# Patient Record
Sex: Female | Born: 1960 | Race: Black or African American | Hispanic: No | Smoking: Never smoker
Health system: Southern US, Community
[De-identification: ages and names within clinical notes are randomized; demographics above are authoritative.]

## PROBLEM LIST (undated history)

## (undated) DIAGNOSIS — D649 Anemia, unspecified: Secondary | ICD-10-CM

## (undated) DIAGNOSIS — D219 Benign neoplasm of connective and other soft tissue, unspecified: Secondary | ICD-10-CM

## (undated) HISTORY — PX: TUBAL LIGATION: SHX77

---

## 2001-05-26 ENCOUNTER — Other Ambulatory Visit: Admission: RE | Admit: 2001-05-26 | Discharge: 2001-05-26 | Payer: Self-pay | Admitting: *Deleted

## 2003-01-18 ENCOUNTER — Emergency Department (HOSPITAL_COMMUNITY): Admission: EM | Admit: 2003-01-18 | Discharge: 2003-01-18 | Payer: Self-pay | Admitting: *Deleted

## 2003-01-18 ENCOUNTER — Encounter: Payer: Self-pay | Admitting: *Deleted

## 2007-12-02 ENCOUNTER — Ambulatory Visit (HOSPITAL_COMMUNITY): Admission: RE | Admit: 2007-12-02 | Discharge: 2007-12-02 | Payer: Self-pay | Admitting: Family Medicine

## 2008-09-16 ENCOUNTER — Emergency Department (HOSPITAL_COMMUNITY): Admission: EM | Admit: 2008-09-16 | Discharge: 2008-09-16 | Payer: Self-pay | Admitting: Emergency Medicine

## 2009-12-25 ENCOUNTER — Ambulatory Visit (HOSPITAL_COMMUNITY): Admission: RE | Admit: 2009-12-25 | Discharge: 2009-12-25 | Payer: Self-pay | Admitting: Family Medicine

## 2011-09-10 ENCOUNTER — Other Ambulatory Visit: Payer: Self-pay | Admitting: Family Medicine

## 2011-09-10 LAB — CBC
HCT: 32.3 — ABNORMAL LOW
Hemoglobin: 10.5 — ABNORMAL LOW
MCHC: 32.4
MCV: 79.3
Platelets: 175
RBC: 4.07
RDW: 21.3 — ABNORMAL HIGH
WBC: 7

## 2011-09-10 LAB — DIFFERENTIAL
Basophils Absolute: 0
Basophils Relative: 0
Neutro Abs: 5.3
Neutrophils Relative %: 75

## 2011-09-10 LAB — PREGNANCY, URINE: Preg Test, Ur: NEGATIVE

## 2011-09-12 ENCOUNTER — Ambulatory Visit (HOSPITAL_COMMUNITY)
Admission: RE | Admit: 2011-09-12 | Discharge: 2011-09-12 | Disposition: A | Discharge status: Home / Self Care | Payer: BC Managed Care – PPO | Source: Ambulatory Visit | Attending: Family Medicine | Admitting: Family Medicine

## 2011-09-12 DIAGNOSIS — R109 Unspecified abdominal pain: Secondary | ICD-10-CM | POA: Insufficient documentation

## 2012-01-03 IMAGING — US US ABDOMEN COMPLETE
1 series · 14 of 25 positions shown · non-contrast
Comparison: None

CLINICAL DATA: Abdominal pain

ULTRASOUND ABDOMEN:
TECHNIQUE: Sonography of upper abdominal structures was performed.

[Series 1: us abdomen complete · 0.21mm/px · 14 of 92 slices shown]
[im 1/92]
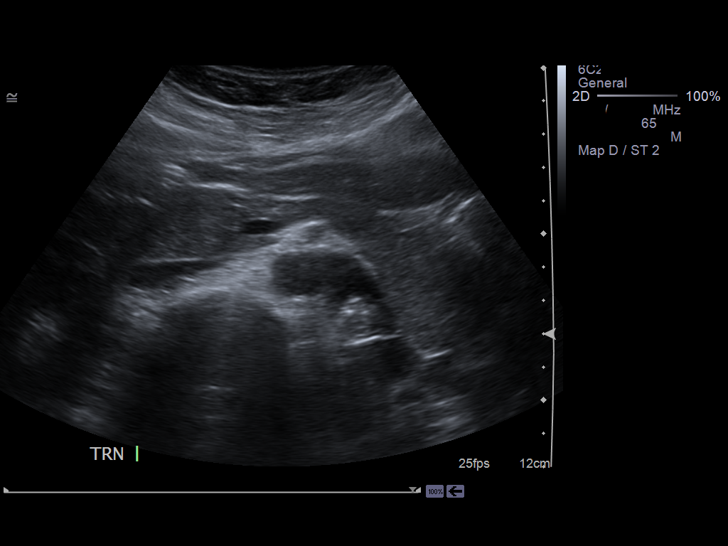
[im 8/92]
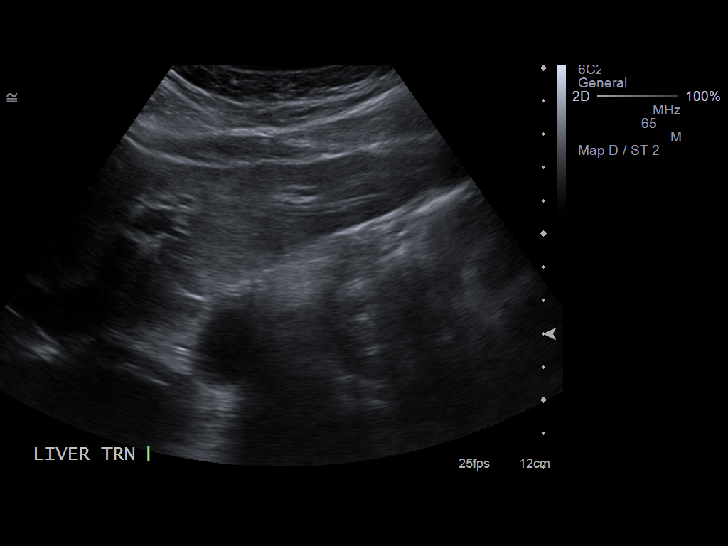
[im 16/92]
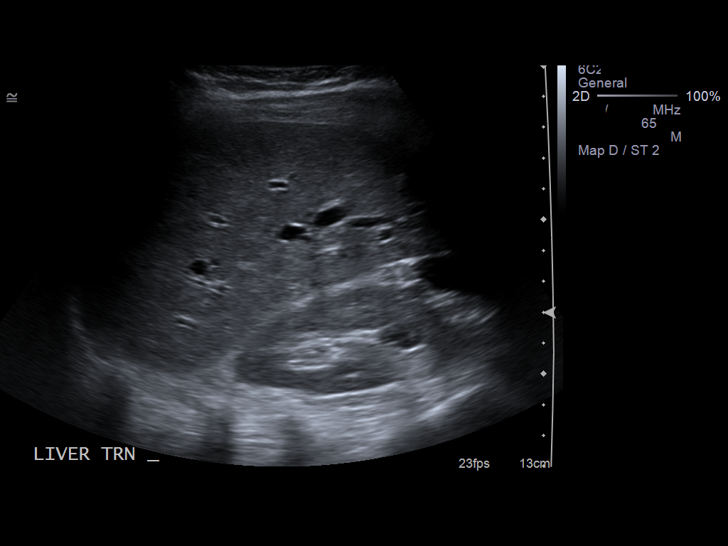
[im 23/92]
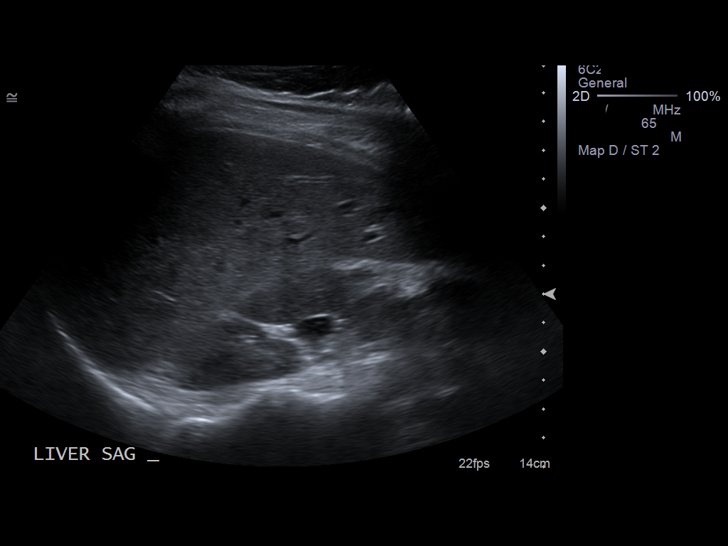
[im 31/92]
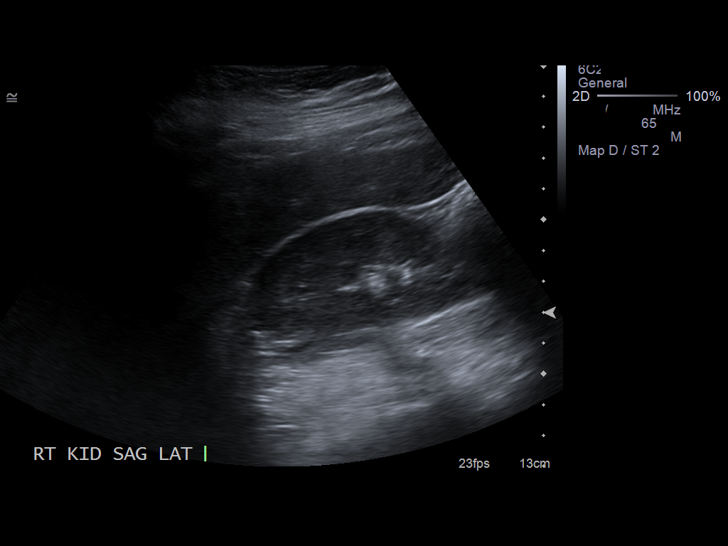
[im 35/92]
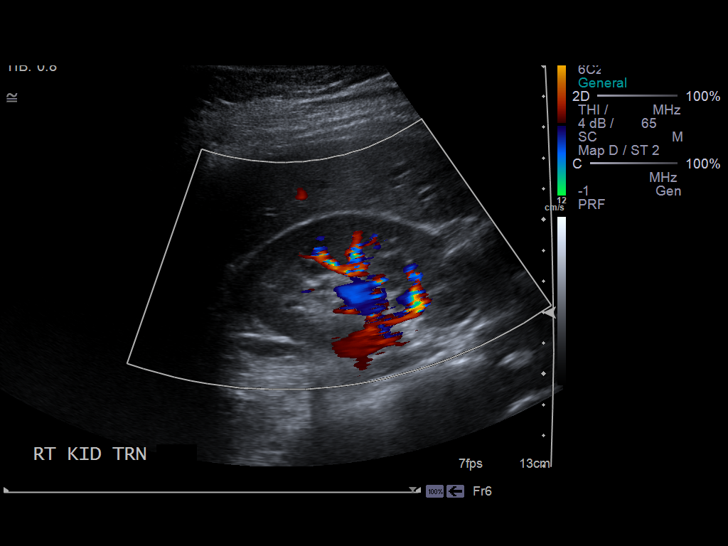
[im 42/92]
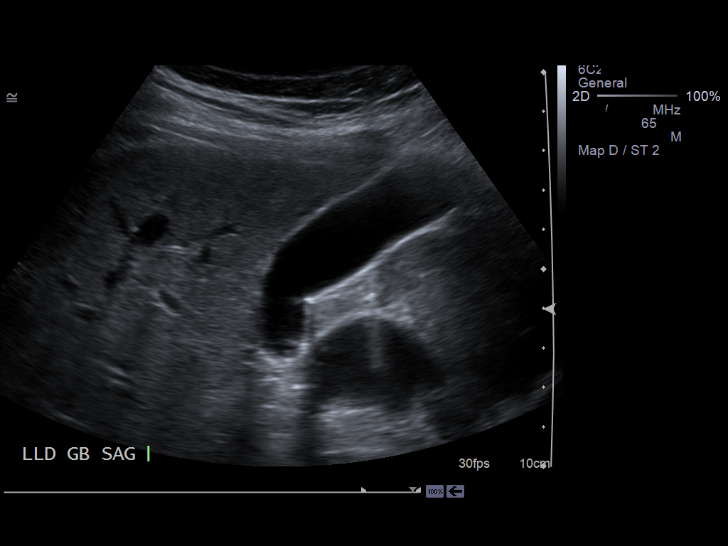
[im 50/92]
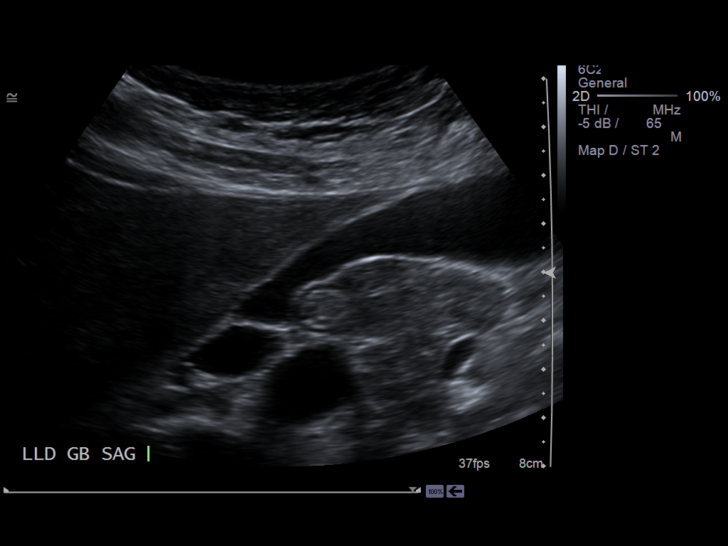
[im 57/92]
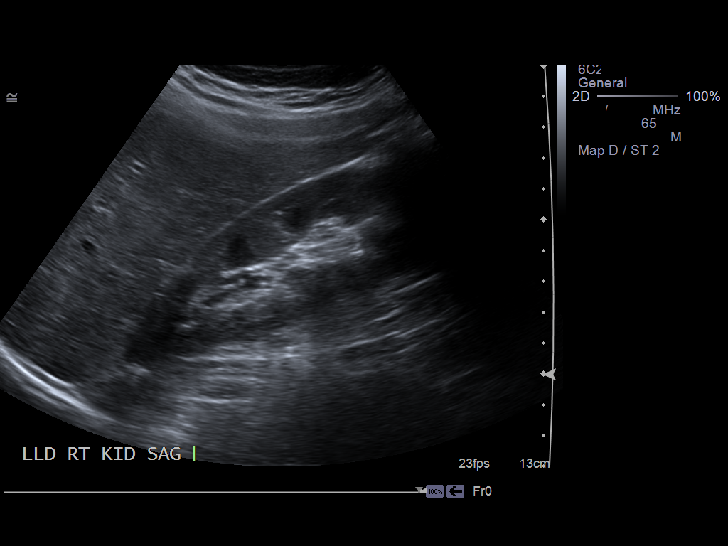
[im 61/92]
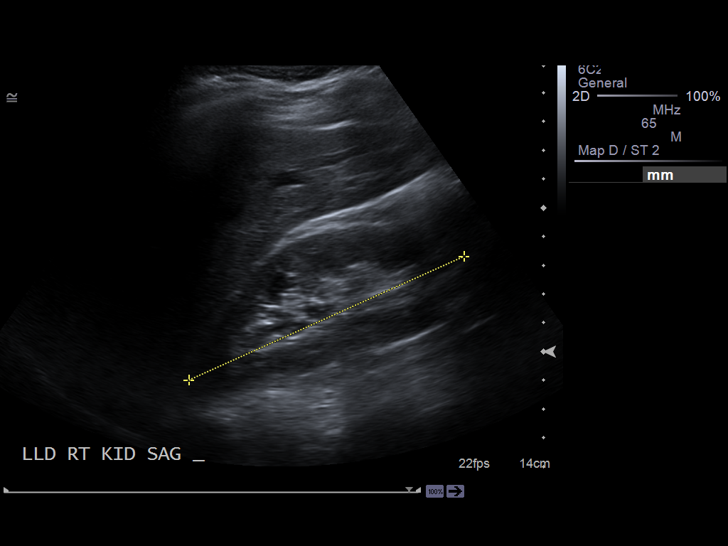
[im 69/92]
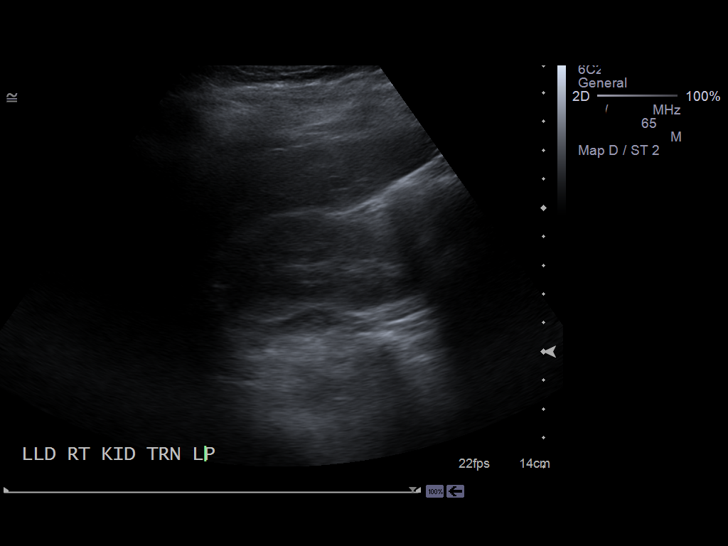
[im 76/92]
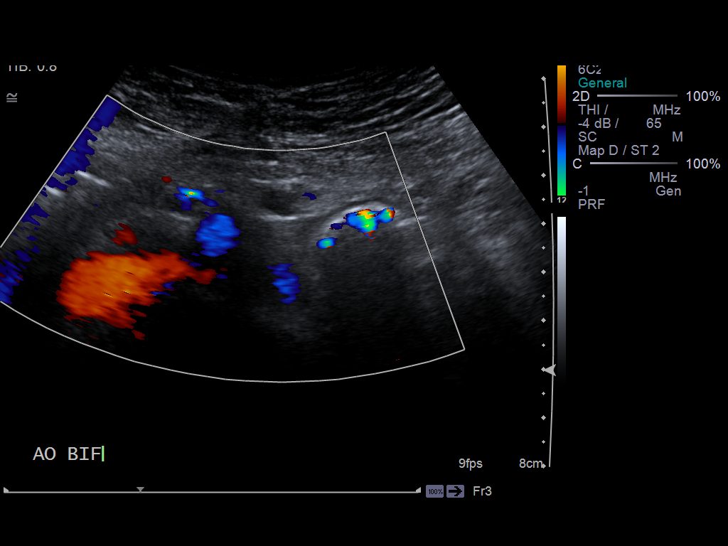
[im 84/92]
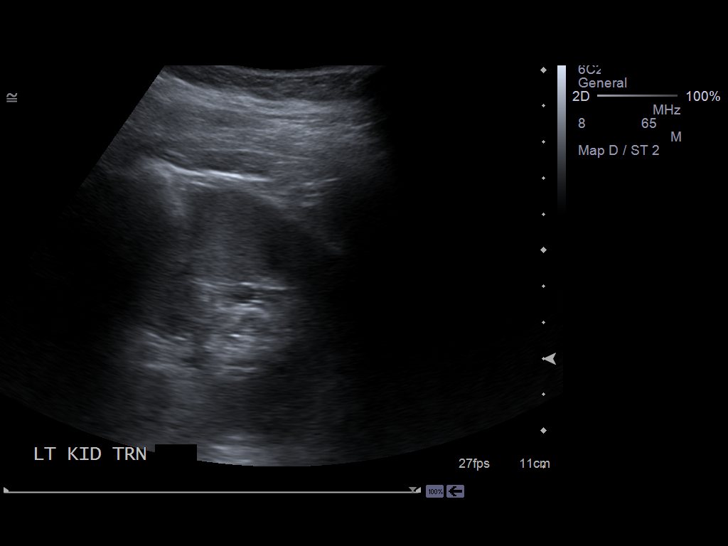
[im 92/92]
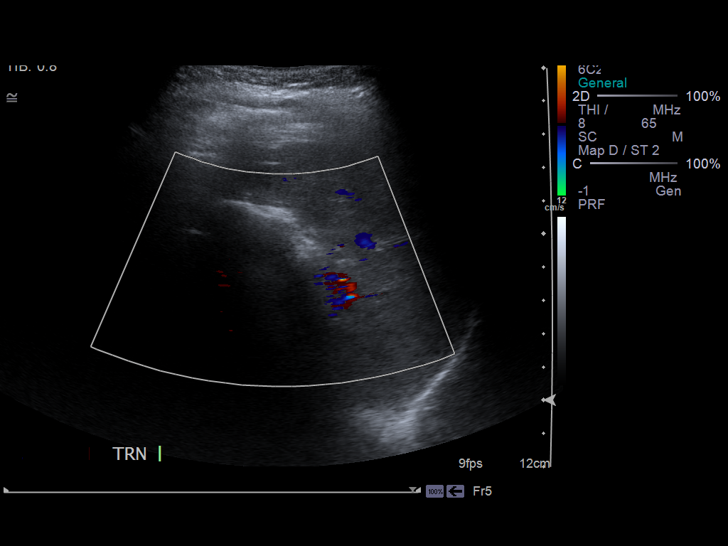

[14 of 25 positions shown; findings below may reference images not displayed]

Gallbladder:  Normally distended without stones or wall thickening.
No pericholecystic fluid or sonographic Murphy sign.

Common bile duct:  Normal caliber 3 mm diameter

Liver:  Normal appearance

IVC:  Normal appearance

Pancreas:  Normal appearance

Spleen:  Normal appearance, 7.7 cm length

Right kidney:  11.8 cm length. Normal morphology without mass or
hydronephrosis.

Left kidney:  12.3 cm length.  Probable dromedary hump at mid left
kidney, normal variant.  No mass or hydronephrosis.

Aorta:  Normal caliber

Other:  No free fluid
IMPRESSION: Normal exam.

## 2012-01-20 ENCOUNTER — Other Ambulatory Visit: Payer: Self-pay | Admitting: Obstetrics and Gynecology

## 2012-01-20 ENCOUNTER — Other Ambulatory Visit (HOSPITAL_COMMUNITY): Payer: Self-pay | Admitting: Obstetrics and Gynecology

## 2012-01-20 DIAGNOSIS — Z139 Encounter for screening, unspecified: Secondary | ICD-10-CM

## 2012-01-20 DIAGNOSIS — Z1231 Encounter for screening mammogram for malignant neoplasm of breast: Secondary | ICD-10-CM

## 2012-01-23 ENCOUNTER — Ambulatory Visit: Payer: BC Managed Care – PPO

## 2012-01-23 ENCOUNTER — Ambulatory Visit (HOSPITAL_COMMUNITY)
Admission: RE | Admit: 2012-01-23 | Discharge: 2012-01-23 | Disposition: A | Discharge status: Home / Self Care | Payer: BC Managed Care – PPO | Source: Ambulatory Visit | Attending: Obstetrics and Gynecology | Admitting: Obstetrics and Gynecology

## 2012-01-23 DIAGNOSIS — Z1231 Encounter for screening mammogram for malignant neoplasm of breast: Secondary | ICD-10-CM | POA: Insufficient documentation

## 2012-01-23 DIAGNOSIS — Z139 Encounter for screening, unspecified: Secondary | ICD-10-CM

## 2012-01-29 ENCOUNTER — Other Ambulatory Visit: Payer: Self-pay | Admitting: Obstetrics and Gynecology

## 2012-01-29 DIAGNOSIS — R928 Other abnormal and inconclusive findings on diagnostic imaging of breast: Secondary | ICD-10-CM

## 2012-02-12 ENCOUNTER — Other Ambulatory Visit: Payer: Self-pay | Admitting: Obstetrics and Gynecology

## 2012-02-12 ENCOUNTER — Ambulatory Visit (HOSPITAL_COMMUNITY)
Admission: RE | Admit: 2012-02-12 | Discharge: 2012-02-12 | Disposition: A | Discharge status: Home / Self Care | Payer: BC Managed Care – PPO | Source: Ambulatory Visit | Attending: Obstetrics and Gynecology | Admitting: Obstetrics and Gynecology

## 2012-02-12 DIAGNOSIS — R928 Other abnormal and inconclusive findings on diagnostic imaging of breast: Secondary | ICD-10-CM | POA: Insufficient documentation

## 2012-02-17 ENCOUNTER — Encounter (HOSPITAL_COMMUNITY): Payer: Self-pay

## 2012-02-28 ENCOUNTER — Other Ambulatory Visit: Payer: Self-pay | Admitting: Obstetrics and Gynecology

## 2012-03-05 ENCOUNTER — Other Ambulatory Visit (HOSPITAL_COMMUNITY): Payer: BC Managed Care – PPO

## 2012-03-09 ENCOUNTER — Encounter (HOSPITAL_COMMUNITY): Admission: RE | Payer: Self-pay | Source: Ambulatory Visit

## 2012-03-09 ENCOUNTER — Ambulatory Visit (HOSPITAL_COMMUNITY)
Admission: RE | Admit: 2012-03-09 | Payer: BC Managed Care – PPO | Source: Ambulatory Visit | Admitting: Obstetrics and Gynecology

## 2012-03-09 SURGERY — DILATATION & CURETTAGE/HYSTEROSCOPY WITH VERSAPOINT RESECTION
Anesthesia: Choice

## 2012-03-21 ENCOUNTER — Encounter (HOSPITAL_COMMUNITY): Payer: Self-pay | Admitting: *Deleted

## 2012-03-21 ENCOUNTER — Emergency Department (HOSPITAL_COMMUNITY)
Admission: EM | Admit: 2012-03-21 | Discharge: 2012-03-21 | Disposition: A | Discharge status: Home / Self Care | Payer: BC Managed Care – PPO | Attending: Emergency Medicine | Admitting: Emergency Medicine

## 2012-03-21 DIAGNOSIS — N898 Other specified noninflammatory disorders of vagina: Secondary | ICD-10-CM | POA: Insufficient documentation

## 2012-03-21 DIAGNOSIS — N852 Hypertrophy of uterus: Secondary | ICD-10-CM | POA: Insufficient documentation

## 2012-03-21 DIAGNOSIS — N939 Abnormal uterine and vaginal bleeding, unspecified: Secondary | ICD-10-CM

## 2012-03-21 HISTORY — DX: Benign neoplasm of connective and other soft tissue, unspecified: D21.9

## 2012-03-21 HISTORY — DX: Anemia, unspecified: D64.9

## 2012-03-21 LAB — BASIC METABOLIC PANEL
CO2: 25 mEq/L (ref 19–32)
GFR calc non Af Amer: >90 mL/min (ref >90)
Glucose, Bld: 103 mg/dL — ABNORMAL HIGH (ref 70–99)
Potassium: 4.1 mEq/L (ref 3.5–5.1)
Sodium: 135 mEq/L (ref 135–145)

## 2012-03-21 LAB — CBC
Platelets: 188 10*3/uL (ref 150–400)
RBC: 3.29 MIL/uL — ABNORMAL LOW (ref 3.87–5.11)
WBC: 9.9 10*3/uL (ref 4.0–10.5)

## 2012-03-21 LAB — DIFFERENTIAL
Basophils Absolute: 0 10*3/uL (ref 0.0–0.1)
Eosinophils Absolute: 0.2 10*3/uL (ref 0.0–0.7)
Lymphocytes Relative: 24 % (ref 12–46)
Lymphs Abs: 2.4 10*3/uL (ref 0.7–4.0)
Neutrophils Relative %: 70 % (ref 43–77)

## 2012-03-21 LAB — WET PREP, GENITAL
Trich, Wet Prep: NONE SEEN
Yeast Wet Prep HPF POC: NONE SEEN

## 2012-03-21 MED ORDER — MEDROXYPROGESTERONE ACETATE 5 MG PO TABS: 5.0000 mg | ORAL_TABLET | Freq: Every day | ORAL | Status: DC

## 2012-03-21 NOTE — ED Notes (Signed)
Pt c/o 2 episodes of heavy vaginal bleeding with clots that lasted approximately a minute. Pt states that the bleeding was light between the 2 episodes.

## 2012-03-21 NOTE — Discharge Instructions (Signed)

## 2012-03-21 NOTE — ED Provider Notes (Signed)
History     CSN: [id number]  Arrival date & time 03/21/12  1711   First MD Initiated Contact with Patient 03/21/12 1746      Chief Complaint  Patient presents with  . Vaginal Bleeding    (Consider location/radiation/quality/duration/timing/severity/associated sxs/prior treatment) HPI Comments: Patient c/o 2 brief episodes of heavy vaginal bleeding with passage of large blood cots just prior to arrival.  States the episodes lasted less than one minute and she noticed it with standing up.  States she has hx of fibroids and was scheduled to have a D&C but she cancelled it.  She denies weakness, dizziness, syncope, abd pain or vomiting  Patient is a 51 y.o. female presenting with vaginal bleeding. The history is provided by the patient.  Vaginal Bleeding This is a new problem. The current episode started today. The problem occurs rarely. The problem has been waxing and waning. Pertinent negatives include no abdominal pain, chest pain, fever, headaches, myalgias, nausea, numbness, urinary symptoms, vertigo, vomiting or weakness. The symptoms are aggravated by standing. She has tried nothing for the symptoms.    Past Medical History  Diagnosis Date  . Fibroids   . Anemia     Past Surgical History  Procedure Date  . Tubal ligation     History reviewed. No pertinent family history.  History  Substance Use Topics  . Smoking status: Never Smoker   . Smokeless tobacco: Not on file  . Alcohol Use: No    OB History    Grav Para Term Preterm Abortions TAB SAB Ect Mult Living                  Review of Systems  Constitutional: Negative for fever, activity change and appetite change.  Cardiovascular: Negative for chest pain.  Gastrointestinal: Negative for nausea, vomiting and abdominal pain.  Genitourinary: Positive for vaginal bleeding and menstrual problem. Negative for dysuria, vaginal discharge, difficulty urinating and vaginal pain.  Musculoskeletal: Negative.  Negative for  myalgias.  Neurological: Negative for dizziness, vertigo, syncope, weakness, numbness and headaches.  Hematological: Does not bruise/bleed easily.  All other systems reviewed and are negative.    Allergies  Review of patient's allergies indicates no known allergies.  Home Medications   Current Outpatient Rx  Name Route Sig Dispense Refill  . FERROUS SULFATE 325 (65 FE) MG PO TABS Oral Take 325 mg by mouth at bedtime.      BP 121/78  Pulse 92  Temp(Src) 98 F (36.7 C) (Oral)  Resp 18  Ht 5\' 7"  (1.702 m)  Wt 168 lb (76.204 kg)  BMI 26.31 kg/m2  SpO2 100%  LMP 03/17/2012  Physical Exam  Nursing note and vitals reviewed. Constitutional: She is oriented to person, place, and time. She appears well-developed and well-nourished. No distress.  HENT:  Head: Normocephalic and atraumatic.  Cardiovascular: Normal rate, regular rhythm, normal heart sounds and intact distal pulses.   No murmur heard. Pulmonary/Chest: Effort normal and breath sounds normal. No respiratory distress.  Abdominal: Soft. Bowel sounds are normal. She exhibits no distension. There is no tenderness. There is no rebound and no guarding.  Genitourinary: Uterus is enlarged. Cervix exhibits no motion tenderness. Right adnexum displays no mass and no tenderness. Left adnexum displays no mass and no tenderness. There is bleeding around the vagina. No tenderness around the vagina. No foreign body around the vagina.  Musculoskeletal: Normal range of motion. She exhibits no edema.  Neurological: She is alert and oriented to person, place, and time.  She exhibits normal muscle tone. Coordination normal.  Skin: Skin is warm and dry.    ED Course  Procedures (including critical care time)   Results for orders placed during the hospital encounter of 03/21/12  CBC      Component Value Range   WBC 9.9  4.0 - 10.5 (K/uL)   RBC 3.29 (*) 3.87 - 5.11 (MIL/uL)   Hemoglobin 8.3 (*) 12.0 - 15.0 (g/dL)   HCT 16.1 (*) 09.6 -  46.0 (%)   MCV 80.5  78.0 - 100.0 (fL)   MCH 25.2 (*) 26.0 - 34.0 (pg)   MCHC 31.3  30.0 - 36.0 (g/dL)   RDW 04.5  40.9 - 81.1 (%)   Platelets 188  150 - 400 (K/uL)  DIFFERENTIAL      Component Value Range   Neutrophils Relative 70  43 - 77 (%)   Neutro Abs 6.9  1.7 - 7.7 (K/uL)   Lymphocytes Relative 24  12 - 46 (%)   Lymphs Abs 2.4  0.7 - 4.0 (K/uL)   Monocytes Relative 5  3 - 12 (%)   Monocytes Absolute 0.5  0.1 - 1.0 (K/uL)   Eosinophils Relative 2  0 - 5 (%)   Eosinophils Absolute 0.2  0.0 - 0.7 (K/uL)   Basophils Relative 0  0 - 1 (%)   Basophils Absolute 0.0  0.0 - 0.1 (K/uL)  BASIC METABOLIC PANEL      Component Value Range   Sodium 135  135 - 145 (mEq/L)   Potassium 4.1  3.5 - 5.1 (mEq/L)   Chloride 102  96 - 112 (mEq/L)   CO2 25  19 - 32 (mEq/L)   Glucose, Bld 103 (*) 70 - 99 (mg/dL)   BUN 9  6 - 23 (mg/dL)   Creatinine, Ser 9.14  0.50 - 1.10 (mg/dL)   Calcium 8.8  8.4 - 78.2 (mg/dL)   GFR calc non Af Amer >90  >90 (mL/min)   GFR calc Af Amer >90  >90 (mL/min)  WET PREP, GENITAL      Component Value Range   Yeast Wet Prep HPF POC NONE SEEN  NONE SEEN    Trich, Wet Prep NONE SEEN  NONE SEEN    Clue Cells Wet Prep HPF POC FEW (*) NONE SEEN    WBC, Wet Prep HPF POC FEW (*) NONE SEEN         MDM    Consulted Dr. Renae Fickle.  She recommended Provera for 10 days and to have pt f/u with Dr. Cherly Hensen on Monday.  I have discussed care plan with the patient and she agrees to f/u  Patient / Family / Caregiver understand and agree with initial ED impression and plan with expectations set for ED visit. Pt stable in ED with no significant deterioration in condition. Pt feels improved after observation and/or treatment in ED.        Bayley Hurn L. Ria Redcay, [place] 03/26/12 1605

## 2012-03-23 ENCOUNTER — Other Ambulatory Visit: Payer: Self-pay | Admitting: Obstetrics and Gynecology

## 2012-03-26 NOTE — ED Provider Notes (Signed)
Medical screening examination/treatment/procedure(s) were performed by non-physician practitioner and as supervising physician I was immediately available for consultation/collaboration.  Shelda Jakes, MD 03/26/12 [phone]

## 2012-03-27 ENCOUNTER — Encounter (HOSPITAL_COMMUNITY): Payer: Self-pay | Admitting: *Deleted

## 2012-03-27 ENCOUNTER — Encounter (HOSPITAL_COMMUNITY): Payer: Self-pay | Admitting: Anesthesiology

## 2012-03-27 ENCOUNTER — Ambulatory Visit (HOSPITAL_COMMUNITY)
Admission: RE | Admit: 2012-03-27 | Discharge: 2012-03-27 | Disposition: A | Discharge status: Home / Self Care | Payer: BC Managed Care – PPO | Source: Ambulatory Visit | Attending: Obstetrics and Gynecology | Admitting: Obstetrics and Gynecology

## 2012-03-27 ENCOUNTER — Encounter (HOSPITAL_COMMUNITY)
Admission: RE | Disposition: A | Discharge status: Home / Self Care | Payer: Self-pay | Source: Ambulatory Visit | Attending: Obstetrics and Gynecology

## 2012-03-27 ENCOUNTER — Ambulatory Visit (HOSPITAL_COMMUNITY): Payer: BC Managed Care – PPO | Admitting: Anesthesiology

## 2012-03-27 DIAGNOSIS — D250 Submucous leiomyoma of uterus: Secondary | ICD-10-CM | POA: Insufficient documentation

## 2012-03-27 DIAGNOSIS — N840 Polyp of corpus uteri: Secondary | ICD-10-CM | POA: Insufficient documentation

## 2012-03-27 DIAGNOSIS — N920 Excessive and frequent menstruation with regular cycle: Secondary | ICD-10-CM | POA: Insufficient documentation

## 2012-03-27 LAB — CBC
MCH: 25.2 pg — ABNORMAL LOW (ref 26.0–34.0)
MCHC: 30.2 g/dL (ref 30.0–36.0)
Platelets: 276 10*3/uL (ref 150–400)
RDW: 16.2 % — ABNORMAL HIGH (ref 11.5–15.5)

## 2012-03-27 SURGERY — DILATATION & CURETTAGE/HYSTEROSCOPY WITH VERSAPOINT RESECTION
Anesthesia: General | Site: Vagina | Wound class: Clean Contaminated

## 2012-03-27 MED ORDER — IBUPROFEN 800 MG PO TABS: 800.0000 mg | ORAL_TABLET | ORAL | Status: AC

## 2012-03-27 MED ORDER — PROPOFOL 10 MG/ML IV EMUL
INTRAVENOUS | Status: DC | PRN
  Administered 2012-03-27: 150 mg via INTRAVENOUS

## 2012-03-27 MED ORDER — DEXAMETHASONE SODIUM PHOSPHATE 4 MG/ML IJ SOLN
INTRAMUSCULAR | Status: DC | PRN
  Administered 2012-03-27: 10 mg via INTRAVENOUS

## 2012-03-27 MED ORDER — CHLOROPROCAINE HCL 1 % IJ SOLN
INTRAMUSCULAR | Status: DC | PRN
  Administered 2012-03-27: 10 mL

## 2012-03-27 MED ORDER — KETOROLAC TROMETHAMINE 30 MG/ML IJ SOLN
INTRAMUSCULAR | Status: AC
  Filled 2012-03-27: qty 1

## 2012-03-27 MED ORDER — KETOROLAC TROMETHAMINE 60 MG/2ML IM SOLN
INTRAMUSCULAR | Status: AC
  Filled 2012-03-27: qty 2

## 2012-03-27 MED ORDER — MIDAZOLAM HCL 5 MG/5ML IJ SOLN
INTRAMUSCULAR | Status: DC | PRN
  Administered 2012-03-27: 2 mg via INTRAVENOUS

## 2012-03-27 MED ORDER — ONDANSETRON HCL 4 MG/2ML IJ SOLN
INTRAMUSCULAR | Status: DC | PRN
  Administered 2012-03-27: 4 mg via INTRAVENOUS

## 2012-03-27 MED ORDER — CHLOROPROCAINE HCL 1 % IJ SOLN
INTRAMUSCULAR | Status: AC
  Filled 2012-03-27: qty 30

## 2012-03-27 MED ORDER — FENTANYL CITRATE 0.05 MG/ML IJ SOLN
INTRAMUSCULAR | Status: DC | PRN
  Administered 2012-03-27 (×4): 50 ug via INTRAVENOUS

## 2012-03-27 MED ORDER — MIDAZOLAM HCL 2 MG/2ML IJ SOLN
INTRAMUSCULAR | Status: AC
  Filled 2012-03-27: qty 2

## 2012-03-27 MED ORDER — SODIUM CHLORIDE 0.9 % IR SOLN
Status: DC | PRN
  Administered 2012-03-27: 9000 mL

## 2012-03-27 MED ORDER — LACTATED RINGERS IV SOLN
INTRAVENOUS | Status: DC
  Administered 2012-03-27 (×4): via INTRAVENOUS

## 2012-03-27 MED ORDER — KETOROLAC TROMETHAMINE 30 MG/ML IJ SOLN
INTRAMUSCULAR | Status: DC | PRN
  Administered 2012-03-27: 60 mg via INTRAVENOUS

## 2012-03-27 MED ORDER — ONDANSETRON HCL 4 MG/2ML IJ SOLN
INTRAMUSCULAR | Status: AC
  Filled 2012-03-27: qty 2

## 2012-03-27 MED ORDER — PROPOFOL 10 MG/ML IV EMUL
INTRAVENOUS | Status: AC
  Filled 2012-03-27: qty 20

## 2012-03-27 MED ORDER — DEXAMETHASONE SODIUM PHOSPHATE 10 MG/ML IJ SOLN
INTRAMUSCULAR | Status: AC
  Filled 2012-03-27: qty 1

## 2012-03-27 MED ORDER — LIDOCAINE HCL (CARDIAC) 20 MG/ML IV SOLN
INTRAVENOUS | Status: DC | PRN
  Administered 2012-03-27: 80 mg via INTRAVENOUS

## 2012-03-27 MED ORDER — FENTANYL CITRATE 0.05 MG/ML IJ SOLN: 25.0000 ug | INTRAMUSCULAR | Status: DC | PRN

## 2012-03-27 MED ORDER — FENTANYL CITRATE 0.05 MG/ML IJ SOLN
INTRAMUSCULAR | Status: AC
  Filled 2012-03-27: qty 4

## 2012-03-27 MED ORDER — LIDOCAINE HCL (CARDIAC) 20 MG/ML IV SOLN
INTRAVENOUS | Status: AC
  Filled 2012-03-27: qty 5

## 2012-03-27 SURGICAL SUPPLY — 21 items
APPLICATOR COTTON TIP 6IN STRL (MISCELLANEOUS) ×2 IMPLANT
CANISTER SUCTION 2500CC (MISCELLANEOUS) ×2 IMPLANT
CATH ROBINSON RED A/P 16FR (CATHETERS) ×2 IMPLANT
CLOTH BEACON ORANGE TIMEOUT ST (SAFETY) ×2 IMPLANT
CONTAINER PREFILL 10% NBF 60ML (FORM) ×4 IMPLANT
ELECT REM PT RETURN 9FT ADLT (ELECTROSURGICAL) ×2
ELECTRODE REM PT RTRN 9FT ADLT (ELECTROSURGICAL) ×1 IMPLANT
ELECTRODE ROLLER VERSAPOINT (ELECTRODE) IMPLANT
ELECTRODE RT ANGLE VERSAPOINT (CUTTING LOOP) ×2 IMPLANT
GLOVE BIO SURGEON STRL SZ 6.5 (GLOVE) ×4 IMPLANT
GLOVE BIOGEL PI IND STRL 6.5 (GLOVE) ×1 IMPLANT
GLOVE BIOGEL PI IND STRL 7.0 (GLOVE) ×2 IMPLANT
GLOVE BIOGEL PI INDICATOR 6.5 (GLOVE) ×1
GLOVE BIOGEL PI INDICATOR 7.0 (GLOVE) ×2
GLOVE ECLIPSE 6.0 STRL STRAW (GLOVE) ×2 IMPLANT
GOWN PREVENTION PLUS LG XLONG (DISPOSABLE) ×4 IMPLANT
GOWN STRL REIN XL XLG (GOWN DISPOSABLE) ×2 IMPLANT
LOOP ANGLED CUTTING 22FR (CUTTING LOOP) IMPLANT
PACK HYSTEROSCOPY LF (CUSTOM PROCEDURE TRAY) ×2 IMPLANT
TOWEL OR 17X24 6PK STRL BLUE (TOWEL DISPOSABLE) ×4 IMPLANT
WATER STERILE IRR 1000ML POUR (IV SOLUTION) ×2 IMPLANT

## 2012-03-27 NOTE — Preoperative (Signed)
Beta Blockers   Reason not to administer Beta Blockers:Not Applicable 

## 2012-03-27 NOTE — Brief Op Note (Signed)
03/27/2012  9:54 AM  PATIENT:  Natasha Nash  51 y.o. female  PRE-OPERATIVE DIAGNOSIS:  Menorrhagia, Submucosal Fibroid  POST-OPERATIVE DIAGNOSIS:  Menorrhagia, Submucosal Fibroid  PROCEDURE:  Procedure(s) (LRB): DILATATION & CURETTAGE/  DIAGNOSTIC HYSTEROSCOPY,  HYSTEROSCOPIC SUBMUCOSAL FIBROID RESECTION USING VERSAPOINT RESECTION (N/A)  SURGEON:  Surgeon(s) and Role:    * Toniette Devera Cathie Beams, MD - Primary  PHYSICIAN ASSISTANT:   ASSISTANTS: none   ANESTHESIA:   general and paracervical block  EBL:  Total I/O In: 1500 [I.V.:1500] Out: 75 [Urine:25; Blood:50]  BLOOD ADMINISTERED:none  DRAINS: none   LOCAL MEDICATIONS USED:  OTHER NESICAINE  SPECIMEN:  Source of Specimen:  EMC,FIBROID RESECTION, POLYP  DISPOSITION OF SPECIMEN:  PATHOLOGY  COUNTS:  YES  TOURNIQUET:  * No tourniquets in log *  DICTATION: .Other Dictation: Dictation Number  [id number]  PLAN OF CARE: Discharge to home after PACU  PATIENT DISPOSITION:  PACU - hemodynamically stable.   Delay start of Pharmacological VTE agent (>24hrs) due to surgical blood loss or risk of bleeding: no

## 2012-03-27 NOTE — Discharge Instructions (Signed)
CALL  IF TEMP>100.4, NOTHING PER VAGINA X 2 WK, CALL IF SOAKING A MAXI  PAD EVERY HOUR OR MORE FREQUENTLY 

## 2012-03-27 NOTE — Anesthesia Preprocedure Evaluation (Signed)
Anesthesia Evaluation  Patient identified by MRN, date of birth, ID band Patient awake    Reviewed: Allergy & Precautions, H&P , Patient's Chart, lab work & pertinent test results, reviewed documented beta blocker date and time   Airway Mallampati: II TM Distance: >3 FB Neck ROM: full    Dental No notable dental hx.    Pulmonary  breath sounds clear to auscultation  Pulmonary exam normal       Cardiovascular Rhythm:regular Rate:Normal     Neuro/Psych    GI/Hepatic   Endo/Other    Renal/GU      Musculoskeletal   Abdominal   Peds  Hematology   Anesthesia Other Findings Low Hb o/w generally healthy  Reproductive/Obstetrics                           Anesthesia Physical Anesthesia Plan  ASA: II  Anesthesia Plan: General   Post-op Pain Management:    Induction: Intravenous  Airway Management Planned: LMA  Additional Equipment:   Intra-op Plan:   Post-operative Plan:   Informed Consent: I have reviewed the patients History and Physical, chart, labs and discussed the procedure including the risks, benefits and alternatives for the proposed anesthesia with the patient or authorized representative who has indicated his/her understanding and acceptance.   Dental Advisory Given  Plan Discussed with: CRNA and Surgeon  Anesthesia Plan Comments: (  Discussed  general anesthesia, including possible nausea, instrumentation of airway, sore throat,pulmonary aspiration, etc. I asked if the were any outstanding questions, or  concerns before we proceeded. )        Anesthesia Quick Evaluation

## 2012-03-27 NOTE — Transfer of Care (Signed)
Immediate Anesthesia Transfer of Care Note  Patient: Natasha Nash  Procedure(s) Performed: Procedure(s) (LRB): DILATATION & CURETTAGE/HYSTEROSCOPY WITH VERSAPOINT RESECTION (N/A)  Patient Location: PACU  Anesthesia Type: General  Level of Consciousness: awake, alert , oriented and patient cooperative  Airway & Oxygen Therapy: Patient Spontanous Breathing and Patient connected to nasal cannula oxygen  Post-op Assessment: Report given to PACU RN and Post -op Vital signs reviewed and stable  Post vital signs: Reviewed and stable  Complications: No apparent anesthesia complications

## 2012-03-27 NOTE — Anesthesia Postprocedure Evaluation (Signed)
Anesthesia Post Note  Patient: Natasha Nash  Procedure(s) Performed: Procedure(s) (LRB): DILATATION & CURETTAGE/HYSTEROSCOPY WITH VERSAPOINT RESECTION (N/A)  Anesthesia type: General  Patient location: PACU  Post pain: Pain level controlled  Post assessment: Post-op Vital signs reviewed  Last Vitals:  Filed Vitals:   03/27/12 0945  BP: 131/71  Pulse: 81  Temp:   Resp: 19    Post vital signs: Reviewed  Level of consciousness: sedated  Complications: No apparent anesthesia complicationsfj

## 2012-03-28 NOTE — Op Note (Signed)
NAMEARVILLA, SALADA NO.:  [id number]  MEDICAL RECORD NO.:  [id number]  LOCATION:  [facility]                          FACILITY:  [facility]  PHYSICIAN:  Maxie Better, M.D.DATE OF BIRTH:  24-Jan-1961  DATE OF PROCEDURE:  03/27/2012 DATE OF DISCHARGE:  03/27/2012                              OPERATIVE REPORT   PREOPERATIVE DIAGNOSES:  Menorrhagia and submucosal fibroid.  PROCEDURES:  Diagnostic hysteroscopy; hysteroscopic resection of submucosal fibroid, endometrial polyp; dilation and curettage.  POSTOPERATIVE DIAGNOSES:  Menorrhagia, submucosal fibroid, endometrial polyps.  ANESTHESIA:  General, paracervical block.  SURGEON:  Maxie Better, MD  ASSISTANT:  None.  DESCRIPTION OF PROCEDURE:  Under adequate general anesthesia, the patient was placed in the dorsal lithotomy position.  She was sterilely prepped and draped in the usual fashion.  The bladder was catheterized for large amount of urine.  Examination under anesthesia revealed a retroflexed uterus.  No adnexal masses could be appreciated.  A bivalve speculum was placed in the vagina.  10 mL of 1% Nesacaine was injected paracervically at 3 and 9 o'clock.  The anterior lip of the cervix was grasped with a single-tooth tenaculum.  The cervix was then serially dilated up to #27 [facility] dilator.  Attempt at putting the resectoscope at that point was unsuccessful.  The single-tooth tenaculum was placed in the posterior lip of the cervix and the resectoscope was then reinserted, entered into the uterine cavity without incident.  There was a large right lateral submucosal fibroid noted.  No other lesions were initially seen.  The tubal ostia was closed.  Using the VersaPoint apparatus as a resectoscope, the fibroid was resected.  It was then noted to be pedunculated as well and it's base was resected off the lateral wall entirely.  The pieces were removed by either using a polyp forceps, Kelly clamp, or the  Allis clamp.  A large piece remained.  It was difficult to initially remove.  Pieces of it was subsequently shaved off until there was a smaller piece at which time using the large curette the cavity was curetted and the pieces removed including the larger piece. The resectoscope was reinserted.  The polypoid lesion was then noted, which was then resected as well.  No other lesions were then noted at which time, the cavity was curetted for fibroid resections and from endometrium.  Procedure was felt to be complete.  The endocervical canal was without any lesions.  All instruments were then removed from the vagina.  SPECIMENS LABELED:  Fibroid resection, endometrial polyp, and endometrial curetting all sent to pathology.  ESTIMATED BLOOD LOSS:  Minimal.  FLUID DEFICIT:  900 mL.  COMPLICATIONS:  None.  The patient tolerated the procedure well, was transferred to recovery in stable condition.     Maxie Better, M.D.     [place]/MEDQ  D:  03/27/2012  T:  03/28/2012  Job:  [id number]

## 2012-12-31 ENCOUNTER — Ambulatory Visit (INDEPENDENT_AMBULATORY_CARE_PROVIDER_SITE_OTHER): Payer: BC Managed Care – PPO | Admitting: Otolaryngology

## 2012-12-31 DIAGNOSIS — R439 Unspecified disturbances of smell and taste: Secondary | ICD-10-CM

## 2014-04-04 ENCOUNTER — Ambulatory Visit (INDEPENDENT_AMBULATORY_CARE_PROVIDER_SITE_OTHER): Payer: BC Managed Care – PPO | Admitting: Family Medicine

## 2014-04-04 ENCOUNTER — Encounter: Payer: Self-pay | Admitting: Family Medicine

## 2014-04-04 VITALS — BP 128/78 | Temp 98.2°F | Ht 67.0 in | Wt 176.0 lb

## 2014-04-04 DIAGNOSIS — H9209 Otalgia, unspecified ear: Secondary | ICD-10-CM

## 2014-04-04 DIAGNOSIS — H9201 Otalgia, right ear: Secondary | ICD-10-CM

## 2014-04-04 MED ORDER — NAPROXEN 500 MG PO TABS: 500.0000 mg | ORAL_TABLET | Freq: Two times a day (BID) | ORAL | Status: DC

## 2014-04-04 NOTE — Progress Notes (Signed)
   Subjective:    Patient ID: Natasha Nash, female    DOB: 10/11/1961, 53 y.o.   MRN: [id number]  Otalgia  There is pain in the right ear. This is a new problem. The current episode started 1 to 4 weeks ago.   Patient relates your discomfort on the right side sometimes last 3-4 days think it's better will be anywhere from 2-4 hours at a time does not wake her up from the middle of her sleep does not cause nausea vomiting blurred vision or high fevers.   Review of Systems  HENT: Positive for ear pain.        Objective:   Physical Exam Eardrums normal throat is normal neck no masses sinus nontender lungs clear heart regular       Assessment & Plan:  Otalgia-referral to ENT. A thorough evaluation in the throat and neck eardrum did not reveal the source this is been going on for 3 weeks we will use an anti-inflammatory in case there is some component of temporomandibular joint dysfunction but there does not appear to be on the exam. The patient was told it would be in her best interest to half ENT evaluation.

## 2016-01-01 ENCOUNTER — Encounter: Payer: Self-pay | Admitting: Nurse Practitioner

## 2016-01-01 ENCOUNTER — Ambulatory Visit (INDEPENDENT_AMBULATORY_CARE_PROVIDER_SITE_OTHER): Payer: BC Managed Care – PPO | Admitting: Nurse Practitioner

## 2016-01-01 VITALS — BP 122/78 | HR 72 | Wt 172.6 lb

## 2016-01-01 DIAGNOSIS — G609 Hereditary and idiopathic neuropathy, unspecified: Secondary | ICD-10-CM

## 2016-01-01 DIAGNOSIS — D649 Anemia, unspecified: Secondary | ICD-10-CM | POA: Insufficient documentation

## 2016-01-01 LAB — POCT GLYCOSYLATED HEMOGLOBIN (HGB A1C): Hemoglobin A1C: 5.7

## 2016-01-01 LAB — POCT HEMOGLOBIN: Hemoglobin: 10.9 g/dL — AB (ref 12.2–16.2)

## 2016-01-02 ENCOUNTER — Encounter: Payer: Self-pay | Admitting: Nurse Practitioner

## 2016-01-02 ENCOUNTER — Encounter: Payer: Self-pay | Admitting: Family Medicine

## 2016-01-02 ENCOUNTER — Telehealth: Payer: Self-pay | Admitting: Nurse Practitioner

## 2016-01-02 DIAGNOSIS — D649 Anemia, unspecified: Secondary | ICD-10-CM

## 2016-01-02 DIAGNOSIS — D509 Iron deficiency anemia, unspecified: Secondary | ICD-10-CM | POA: Insufficient documentation

## 2016-01-02 LAB — CBC WITH DIFFERENTIAL/PLATELET
BASOS ABS: 0 10*3/uL (ref 0.0–0.2)
BASOS: 0 %
EOS (ABSOLUTE): 0.1 10*3/uL (ref 0.0–0.4)
EOS: 2 %
Hematocrit: 35 % (ref 34.0–46.6)
Hemoglobin: 10.3 g/dL — ABNORMAL LOW (ref 11.1–15.9)
IMMATURE GRANS (ABS): 0 10*3/uL (ref 0.0–0.1)
IMMATURE GRANULOCYTES: 0 %
Lymphocytes Absolute: 2.4 10*3/uL (ref 0.7–3.1)
Lymphs: 33 %
MCH: 21.8 pg — AB (ref 26.6–33.0)
MCHC: 29.4 g/dL — ABNORMAL LOW (ref 31.5–35.7)
MCV: 74 fL — ABNORMAL LOW (ref 79–97)
MONOCYTES: 6 %
MONOS ABS: 0.4 10*3/uL (ref 0.1–0.9)
NEUTROS PCT: 59 %
Neutrophils Absolute: 4.3 10*3/uL (ref 1.4–7.0)
PLATELETS: 250 10*3/uL (ref 150–379)
RBC: 4.73 x10E6/uL (ref 3.77–5.28)
RDW: 19.5 % — AB (ref 12.3–15.4)
WBC: 7.3 10*3/uL (ref 3.4–10.8)

## 2016-01-02 LAB — VITAMIN D 25 HYDROXY (VIT D DEFICIENCY, FRACTURES): VIT D 25 HYDROXY: 39.4 ng/mL (ref 30.0–100.0)

## 2016-01-02 LAB — VITAMIN B12: Vitamin B-12: 1717 pg/mL — ABNORMAL HIGH (ref 211–946)

## 2016-01-02 LAB — FERRITIN: FERRITIN: 5 ng/mL — AB (ref 15–150)

## 2016-01-02 NOTE — Telephone Encounter (Signed)
LMRC

## 2016-01-02 NOTE — Telephone Encounter (Signed)
I reviewed her chart:  She had Hgb electrophoresis which checks for hereditary problems such as sickle cell and thalassemia; tests were normal.  There are no records of what Dr. Estanislado Pandy tested for. She did hemoccult cards which were neg for blood in her stool. We know that she had severe anemia at one point due to her cycles. This could be a normal finding for her, but we need to know why she is staying anemic.  The first thing is I STRONGLY recommend a colonoscopy because of her age and persistent anemia. If there no cause for anemia on this exam, we will discuss next step.

## 2016-01-02 NOTE — Telephone Encounter (Signed)
Discussed with pt. Order for referral put in for colonoscopy.  Pt wants to see Dr. Oneida Alar.

## 2016-01-02 NOTE — Progress Notes (Signed)
Subjective:  Presents with complaints of off-and-on burning sensation on the bottom of both of her feet that began in November. For the past 2 weeks has occurred daily. Began a calcium and vitamin D supplement about 9 days ago. Taking daily iron as well. Symptoms have since resolved. Has a history of iron deficiency anemia. Has also been taking a B-12 supplement. 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 Has seen ENT specialist for this. Has also affected her sense of taste.  Objective:   BP 122/78 mmHg  Pulse 72  Wt 172 lb 9.6 oz (78.291 kg) NAD. Alert, oriented. Lungs clear. Heart regular rate rhythm. Foot exam: DP pulses present bilaterally. Monofilament test normal. Hemoglobin A1c 5.7 and hemoglobin 10.9.   Assessment:  Problem List Items Addressed This Visit      Other   Anemia   Relevant Orders   POCT hemoglobin (Completed)   B12 (Completed)   Ferritin (Completed)    Other Visit Diagnoses    Idiopathic peripheral neuropathy ([place])    -  Primary    Relevant Orders    POCT HgB A1C (Completed)    CBC with Differential/Platelet (Completed)    Vitamin D (25 hydroxy) (Completed)      Plan: Further follow-up based on lab results.

## 2016-01-11 ENCOUNTER — Encounter: Payer: Self-pay | Admitting: Gastroenterology

## 2016-01-25 ENCOUNTER — Ambulatory Visit: Payer: BC Managed Care – PPO | Admitting: Nurse Practitioner

## 2016-07-19 ENCOUNTER — Ambulatory Visit (INDEPENDENT_AMBULATORY_CARE_PROVIDER_SITE_OTHER): Payer: BC Managed Care – PPO | Admitting: Family Medicine

## 2016-07-19 ENCOUNTER — Encounter: Payer: Self-pay | Admitting: Family Medicine

## 2016-07-19 VITALS — Ht 67.0 in

## 2016-07-19 DIAGNOSIS — B86 Scabies: Secondary | ICD-10-CM

## 2016-07-19 MED ORDER — PERMETHRIN 5 % EX CREA: 1.0000 "application " | TOPICAL_CREAM | Freq: Once | CUTANEOUS | 1 refills | Status: AC

## 2016-07-19 NOTE — Progress Notes (Signed)
   Subjective:    Patient ID: Natasha Nash, female    DOB: 20-Jan-1961, 55 y.o.   MRN: [id number]  HPI Patient arrives with c/o rask on arms and hands This been going on for a few weeks other members of the household have similar past medical history benign  Review of Systems No fever no wheezing no hives no difficulty breathing    Objective:   Physical Exam  Multiple small bumps on the arms and some on the legs    Assessment & Plan:  Consistent with scabies treatment prescribed warnings discuss

## 2016-07-26 ENCOUNTER — Telehealth: Payer: Self-pay | Admitting: Family Medicine

## 2016-07-26 MED ORDER — PERMETHRIN 5 % EX CREA
1.0000 | TOPICAL_CREAM | Freq: Once | CUTANEOUS | 1 refills | Status: AC
Start: 2016-07-26 — End: 2016-07-26

## 2016-07-26 NOTE — Telephone Encounter (Signed)
Patient seen on 07/19/16 for scabies.  Itching has improved, but not gone away.  Would it be okay if she used the cream again?

## 2016-07-26 NOTE — Telephone Encounter (Signed)
Med sent to pharmacy. Notified patient if persistant despite 2 treatments then derm consult.

## 2016-07-26 NOTE — Telephone Encounter (Signed)
May refill, if sx persist after 2nd treatment then needs derm referal ( have family call for referral if persist)

## 2017-02-24 ENCOUNTER — Telehealth: Payer: Self-pay | Admitting: Nurse Practitioner

## 2017-02-24 ENCOUNTER — Other Ambulatory Visit: Payer: Self-pay | Admitting: Nurse Practitioner

## 2017-02-24 ENCOUNTER — Encounter: Payer: Self-pay | Admitting: Family Medicine

## 2017-02-24 DIAGNOSIS — R52 Pain, unspecified: Secondary | ICD-10-CM

## 2017-02-24 NOTE — Telephone Encounter (Signed)
I would start with rheumatology for both of these problems. If determined this is not related to a rheumatology problem, please recheck with [place]. Will send in referral request.

## 2017-02-24 NOTE — Telephone Encounter (Signed)
Patient said she saw Hoyle Sauer about a year ago for burning sensation in her feet.  It has come back and it is now a continuous burning in her feet.  She is requesting referral for Rheumatology.  She has seen Dr. Estanislado Pandy in the past but it has been so long that they are requiring a referral.    Also, she has been seen in the past for stiffness in hands.  It has also come back and it seems to be lasting longer than it used to.

## 2017-02-24 NOTE — Telephone Encounter (Signed)
Left message return call 02/24/17

## 2017-02-26 NOTE — Telephone Encounter (Signed)
Patient advised Hoyle Sauer would start with rheumatology for both of these problems. If determined this is not related to a rheumatology problem, please recheck with [place]. Will send in referral request. Patient verbalized understanding

## 2017-06-20 NOTE — Progress Notes (Signed)
Office Visit Note  Patient: Natasha Nash             Date of Birth: [birth year]           MRN: [id number]             PCP: Kathyrn Drown, MD Referring: Kathyrn Drown, MD Visit Date: 06/23/2017 Occupation: Case manager for students under the [employer]    Subjective:  Pain in hands and feet.   History of Present Illness: Natasha Nash is a 56 y.o. female seen in consultation per request of Dr. Wolfgang Phoenix. According to patient in 2006 she had an episode of plantar fasciitis which improved after changing her shoes. She did fine for several years in May 2017 she started having pain in her left heel which gradually progressed to the back of her ankle. She states she was having difficulty walking and she changed shoes but she did not help. Gradually she started having pain in her bilateral feet and burning sensation. She went to see Dr. Wolfgang Phoenix and was seen by a PA who did lab work and the labs were unremarkable. She states the symptoms moved to her hands with numbness and difficulty making a fist. She was also having joint pain in her hands. She decided to make some dietary changes and she went on gluten-free diet and stopped eating red meat. She states she noticed improvement in her symptoms. On July 4 she decided to eat regular meals and she noticed recurrence of discomfort in the bottom of her feet.. She is back to her previous diet of vegetables , fruits and she does not consume any red meat. She has only mild stiffness in her hands in the morning. Her feet symptoms have resolved. The joint swelling is better she is able to wear her rings again. None of the other joints are painful. She states she has had problems with knee popping in the past but it is resolved now. She also had some discomfort in her left elbow early this year .  Activities of Daily Living:  Patient reports morning stiffness for 0 minute.   Patient Denies nocturnal pain.  Difficulty dressing/grooming: Denies Difficulty climbing  stairs: Denies Difficulty getting out of chair: Denies Difficulty using hands for taps, buttons, cutlery, and/or writing: Denies   Review of Systems  Constitutional: Positive for weight loss. Negative for fatigue, night sweats, weight gain and weakness.       Due to change in her diet approximately 5 pounds weight loss in 2-1/2 months.  HENT: Negative for mouth sores, trouble swallowing, trouble swallowing, mouth dryness and nose dryness.   Eyes: Negative for pain, redness, visual disturbance and dryness.  Respiratory: Negative for cough, shortness of breath and difficulty breathing.   Cardiovascular: Negative for chest pain, palpitations, hypertension, irregular heartbeat and swelling in legs/feet.  Gastrointestinal: Negative for blood in stool, constipation and diarrhea.  Endocrine: Negative for increased urination.  Genitourinary: Negative for vaginal dryness.  Musculoskeletal: Positive for arthralgias, joint pain and morning stiffness. Negative for joint swelling, myalgias, muscle weakness, muscle tenderness and myalgias.  Skin: Negative for color change, rash, hair loss, skin tightness, ulcers and sensitivity to sunlight.  Allergic/Immunologic: Negative for susceptible to infections.  Neurological: Negative for dizziness, memory loss and night sweats.  Hematological: Negative for swollen glands.  Psychiatric/Behavioral: Negative for depressed mood and sleep disturbance. The patient is not nervous/anxious.     PMFS History:  Patient Active Problem List   Diagnosis Date Noted  .  Iron deficiency anemia 01/02/2016  . Anemia 01/01/2016    Past Medical History:  Diagnosis Date  . Anemia   . Fibroids     No family history on file. Past Surgical History:  Procedure Laterality Date  . TUBAL LIGATION     Social History   Social History Narrative  . No narrative on file     Objective: Vital Signs: BP 130/72 (BP Location: Right Arm)   Pulse 78   Resp 14   Ht 5\' 7"  (1.702 m)    Wt 162 lb (73.5 kg)   LMP 03/17/2012   BMI 25.37 kg/m    Physical Exam  Constitutional: She is oriented to person, place, and time. She appears well-developed and well-nourished.  HENT:  Head: Normocephalic and atraumatic.  Eyes: Conjunctivae and EOM are normal.  Neck: Normal range of motion.  Cardiovascular: Normal rate, regular rhythm, normal heart sounds and intact distal pulses.   Pulmonary/Chest: Effort normal and breath sounds normal.  Abdominal: Soft. Bowel sounds are normal.  Lymphadenopathy:    She has no cervical adenopathy.  Neurological: She is alert and oriented to person, place, and time.  Skin: Skin is warm and dry. Capillary refill takes less than 2 seconds.  Psychiatric: She has a normal mood and affect. Her behavior is normal.  Nursing note and vitals reviewed.    Musculoskeletal Exam: C-spine and thoracic lumbar spine good range of motion. Shoulder joints elbow joints wrist joint MCPs PIPs DIPs with good range of motion. She is some tenderness over the PIP joints but no warmth swelling or effusion was noted. Hip joints knee joints ankles MTPs PIPs DIPs with good range of motion with no synovitis. She had no tenderness over the plantar fascia or Achillis tendon.  CDAI Exam: No CDAI exam completed.    Investigation: No additional findings. Henry 23rd 2017 CBC hemoglobin 10.3 hemoglobin A1c 5.7, vitamin D 39.4, ferritin low at 5, B12 normal  Imaging: Xr Foot 2 Views Left  Result Date: 06/23/2017 First MTP, PIP and DIP narrowing was noted. No erosive changes were noted. A small calcaneal spur was noted. Impression: These findings are consistent with osteoarthritis of the foot  Xr Foot 2 Views Right  Result Date: 06/23/2017 First MTP, PIP and DIP narrowing was noted. No erosive changes were noted. A small calcaneal spur was noted. Impression: These findings are consistent with osteoarthritis of the foot  Xr Hand 2 View Left  Result Date: 06/23/2017 PIP, DIP  and CMC narrowing was noted. No metacarpal or intercarpal joint space narrowing was noted. No erosive changes were noted. Impression: These findings are consistent with mild osteoarthritis of the hand  Xr Hand 2 View Right  Result Date: 06/23/2017 Minimal PIP/DIP and CMC narrowing was noted. No MCP joint narrowing was noted. No intercarpal joint space was noted narrowing was noted. No erosive changes were noted. Enchondroma noted in her right fifth metacarpal. 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   Speciality Comments: No specialty comments available.    Procedures:  No procedures performed Allergies: Patient has no known allergies.   Assessment / Plan:     Visit Diagnoses: Pain in both hands: Patient gives history of intermittent joint swelling significant morning stiffness and difficulty making a fist. Her symptoms are better since she's been on gluten-free diet. She's also avoiding red meat. I do not see any synovitis on examination today. Although she continues to have some tenderness in her PIP  joints. I'll obtain x-rays and labs today which are listed as follows. I've advised her if she has recurrence of symptoms I would like to obtain ultrasound of her hands . A list of natural anti-inflammatories was given as well.  Enchondroma right fifth metacarpal: We will refer her to hand surgery for that. The findings were  discussed with patient.  Pain in both feet: Pain in bilateral feet with history of plantar fasciitis and possible Achillis tendinitis. Although her symptoms are resolved now.  History of anemia - Iron deficiency. Patient states her anemia was related to heavy periods in the past. Now she is postmenopausal. We will check her CBC again today.  Family history of rheumatoid arthritis - First cousin on maternal side    Orders: Orders Placed This Encounter  Procedures  . XR Hand 2 View Right  . XR  Hand 2 View Left  . XR Foot 2 Views Right  . XR Foot 2 Views Left  . CBC with Differential/Platelet  . COMPLETE METABOLIC PANEL WITH GFR  . Sedimentation rate  . Rheumatoid Factor  . Cyclic citrul peptide antibody, IgG  . Antinuclear Antib (ANA)  . Gliadin Antibodies, Serum  . Tissue Transglutaminase Abs,IgG,IgA  . Uric acid  . VITAMIN D 25 Hydroxy (Vit-D Deficiency, Fractures)  . Ambulatory referral to Hand Surgery   No orders of the defined types were placed in this encounter.   Face-to-face time spent with patient was 40 minutes. 50% of time was spent in counseling and coordination of care.  Follow-Up Instructions: Return for Arthralgia.   Bo Merino, MD  Note - This record has been created using Editor, commissioning.  Chart creation errors have been sought, but may not always  have been located. Such creation errors do not reflect on  the standard of medical care.

## 2017-06-23 ENCOUNTER — Encounter: Payer: Self-pay | Admitting: Rheumatology

## 2017-06-23 ENCOUNTER — Ambulatory Visit (INDEPENDENT_AMBULATORY_CARE_PROVIDER_SITE_OTHER): Payer: Self-pay

## 2017-06-23 ENCOUNTER — Encounter (INDEPENDENT_AMBULATORY_CARE_PROVIDER_SITE_OTHER): Payer: Self-pay

## 2017-06-23 ENCOUNTER — Ambulatory Visit (INDEPENDENT_AMBULATORY_CARE_PROVIDER_SITE_OTHER): Payer: BC Managed Care – PPO | Admitting: Rheumatology

## 2017-06-23 VITALS — BP 130/72 | HR 78 | Resp 14 | Ht 67.0 in | Wt 162.0 lb

## 2017-06-23 DIAGNOSIS — Z862 Personal history of diseases of the blood and blood-forming organs and certain disorders involving the immune mechanism: Secondary | ICD-10-CM | POA: Diagnosis not present

## 2017-06-23 DIAGNOSIS — M79641 Pain in right hand: Secondary | ICD-10-CM | POA: Diagnosis not present

## 2017-06-23 DIAGNOSIS — R938 Abnormal findings on diagnostic imaging of other specified body structures: Secondary | ICD-10-CM

## 2017-06-23 DIAGNOSIS — M79642 Pain in left hand: Secondary | ICD-10-CM | POA: Diagnosis not present

## 2017-06-23 DIAGNOSIS — M79672 Pain in left foot: Secondary | ICD-10-CM | POA: Diagnosis not present

## 2017-06-23 DIAGNOSIS — M79671 Pain in right foot: Secondary | ICD-10-CM | POA: Diagnosis not present

## 2017-06-23 DIAGNOSIS — D1611 Benign neoplasm of short bones of right upper limb: Secondary | ICD-10-CM | POA: Diagnosis not present

## 2017-06-23 DIAGNOSIS — Z8261 Family history of arthritis: Secondary | ICD-10-CM | POA: Diagnosis not present

## 2017-06-23 DIAGNOSIS — R9389 Abnormal findings on diagnostic imaging of other specified body structures: Secondary | ICD-10-CM

## 2017-06-23 LAB — CBC WITH DIFFERENTIAL/PLATELET
BASOS PCT: 0 %
Basophils Absolute: 0 cells/uL (ref 0–200)
EOS PCT: 0 %
Eosinophils Absolute: 0 cells/uL — ABNORMAL LOW (ref 15–500)
HCT: 40.4 % (ref 35.0–45.0)
Hemoglobin: 12.7 g/dL (ref 11.7–15.5)
LYMPHS ABS: 960 {cells}/uL (ref 850–3900)
LYMPHS PCT: 10 %
MCH: 25.6 pg — ABNORMAL LOW (ref 27.0–33.0)
MCHC: 31.4 g/dL — AB (ref 32.0–36.0)
MCV: 81.3 fL (ref 80.0–100.0)
Monocytes Absolute: 192 cells/uL — ABNORMAL LOW (ref 200–950)
Monocytes Relative: 2 %
NEUTROS PCT: 88 %
Neutro Abs: 8448 cells/uL — ABNORMAL HIGH (ref 1500–7800)
Platelets: 151 10*3/uL (ref 140–400)
RBC: 4.97 MIL/uL (ref 3.80–5.10)
RDW: 16.3 % — AB (ref 11.0–15.0)
WBC: 9.6 10*3/uL (ref 3.8–10.8)

## 2017-06-23 NOTE — Patient Instructions (Signed)
Natural anti-inflammatories  You can purchase these at [employer], [employer] or online.  . Turmeric (capsules)  . Ginger (ginger root or capsules)  . Omega 3 (Fish, flax seeds, chia seeds, walnuts, almonds)  . Tart cherry (dried or extract)   Patient should be under the care of a physician while taking these supplements. This may not be reproduced without the permission of Dr. Akram Kissick.  

## 2017-06-24 LAB — COMPLETE METABOLIC PANEL WITH GFR
ALT: 8 U/L (ref 6–29)
AST: 14 U/L (ref 10–35)
Albumin: 4.4 g/dL (ref 3.6–5.1)
Alkaline Phosphatase: 70 U/L (ref 33–130)
BUN: 17 mg/dL (ref 7–25)
CHLORIDE: 107 mmol/L (ref 98–110)
CO2: 23 mmol/L (ref 20–31)
CREATININE: 0.75 mg/dL (ref 0.50–1.05)
Calcium: 10.1 mg/dL (ref 8.6–10.4)
GFR, Est African American: >89 mL/min (ref >=60)
GFR, Est Non African American: >89 mL/min (ref >=60)
GLUCOSE: 79 mg/dL (ref 65–99)
POTASSIUM: 4.3 mmol/L (ref 3.5–5.3)
SODIUM: 142 mmol/L (ref 135–146)
Total Bilirubin: 0.7 mg/dL (ref 0.2–1.2)
Total Protein: 6.8 g/dL (ref 6.1–8.1)

## 2017-06-24 LAB — URIC ACID: Uric Acid, Serum: 3.5 mg/dL (ref 2.5–7.0)

## 2017-06-24 LAB — SEDIMENTATION RATE: Sed Rate: 1 mm/hr (ref 0–30)

## 2017-06-24 LAB — CYCLIC CITRUL PEPTIDE ANTIBODY, IGG: Cyclic Citrullin Peptide Ab: <16 Units

## 2017-06-24 LAB — ANA: ANA: NEGATIVE

## 2017-06-24 LAB — VITAMIN D 25 HYDROXY (VIT D DEFICIENCY, FRACTURES): Vit D, 25-Hydroxy: 52 ng/mL (ref 30–100)

## 2017-06-24 LAB — RHEUMATOID FACTOR: Rhuematoid fact SerPl-aCnc: <14 IU/mL (ref <14)

## 2017-06-27 ENCOUNTER — Telehealth: Payer: Self-pay

## 2017-06-27 NOTE — Telephone Encounter (Signed)
Patient would like to have a copy of her x-ray of her hand before her appointment on Tuesday 07/01/17 with the hand specialist.  Would like to know if we knew what her appointment was going to be about on Tuesday.  CB# is [phone].  Please advise.  Thank You.

## 2017-06-27 NOTE — Telephone Encounter (Signed)
I called patient, X-rays at front desk.

## 2017-06-30 LAB — GLIADIN ANTIBODIES, SERUM
GLIADIN IGA: 3 U (ref <20)
GLIADIN IGG: 3 U (ref <20)

## 2017-06-30 LAB — TISSUE TRANSGLUTAMINASE ABS,IGG,IGA
TISSUE TRANSGLUT AB: 1 U/mL (ref <6)
TISSUE TRANSGLUTAMINASE AB, IGA: 1 U/mL (ref <4)

## 2017-06-30 NOTE — Progress Notes (Signed)
WNL, will discuss at fu visit. If she continues to have hand pain and swelling then sch [place] bilateral hands

## 2017-07-17 DIAGNOSIS — M79672 Pain in left foot: Secondary | ICD-10-CM

## 2017-07-17 DIAGNOSIS — M79642 Pain in left hand: Principal | ICD-10-CM

## 2017-07-17 DIAGNOSIS — D1611 Benign neoplasm of short bones of right upper limb: Secondary | ICD-10-CM | POA: Insufficient documentation

## 2017-07-17 DIAGNOSIS — M79641 Pain in right hand: Secondary | ICD-10-CM | POA: Insufficient documentation

## 2017-07-17 DIAGNOSIS — M79671 Pain in right foot: Secondary | ICD-10-CM | POA: Insufficient documentation

## 2017-07-17 DIAGNOSIS — Z8261 Family history of arthritis: Secondary | ICD-10-CM | POA: Insufficient documentation

## 2017-07-17 DIAGNOSIS — Z862 Personal history of diseases of the blood and blood-forming organs and certain disorders involving the immune mechanism: Secondary | ICD-10-CM | POA: Insufficient documentation

## 2017-07-17 NOTE — Progress Notes (Signed)
Office Visit Note  Patient: Natasha Nash             Date of Birth: 1961/01/01           MRN: [id number]             PCP: Kathyrn Drown, MD Referring: Kathyrn Drown, MD Visit Date: 07/24/2017 Occupation: @GUAROCC @    Subjective:  Pain hands and feet.   History of Present Illness: Natasha Nash is a 56 y.o. female with history of osteoarthritis. According to patient she was seen by a hand surgeon Dr. Burney Gauze. She's a scheduled to have surgery for enchondroma on August 27. She also went on vacation rate recently and developed mild symptoms of left Achillis tendinitis. She states the symptoms have improved since then.   Activities of Daily Living:  Patient reports morning stiffness for 2 minutes.   Patient Denies nocturnal pain.  Difficulty dressing/grooming: Denies Difficulty climbing stairs: Denies Difficulty getting out of chair: Denies Difficulty using hands for taps, buttons, cutlery, and/or writing: Denies   Review of Systems  Constitutional: Negative for fatigue, night sweats, weight gain, weight loss and weakness.  HENT: Negative for mouth sores, trouble swallowing, trouble swallowing, mouth dryness and nose dryness.   Eyes: Negative for pain, redness, visual disturbance and dryness.  Respiratory: Negative for cough, shortness of breath and difficulty breathing.   Cardiovascular: Negative for chest pain, palpitations, hypertension, irregular heartbeat and swelling in legs/feet.  Gastrointestinal: Negative for blood in stool, constipation and diarrhea.  Endocrine: Negative for increased urination.  Genitourinary: Negative for vaginal dryness.  Musculoskeletal: Positive for arthralgias and joint pain. Negative for joint swelling, myalgias, muscle weakness, morning stiffness, muscle tenderness and myalgias.  Skin: Negative for color change, rash, hair loss, skin tightness, ulcers and sensitivity to sunlight.  Allergic/Immunologic: Negative for susceptible to infections.   Neurological: Negative for dizziness, memory loss and night sweats.  Hematological: Negative for swollen glands.  Psychiatric/Behavioral: Negative for depressed mood and sleep disturbance. The patient is not nervous/anxious.     PMFS History:  Patient Active Problem List   Diagnosis Date Noted  . Pain in both hands 07/17/2017  . Pain in both feet 07/17/2017  . History of anemia 07/17/2017  . Family history of rheumatoid arthritis 07/17/2017  . Enchondroma of bone of hand, right 07/17/2017  . Iron deficiency anemia 01/02/2016  . Anemia 01/01/2016    Past Medical History:  Diagnosis Date  . Anemia   . Fibroids     No family history on file. Past Surgical History:  Procedure Laterality Date  . TUBAL LIGATION     Social History   Social History Narrative  . No narrative on file     Objective: Vital Signs: BP 130/62   Pulse 72   Resp 14   Ht 5\' 7"  (1.702 m)   Wt 162 lb (73.5 kg)   LMP 03/17/2012   BMI 25.37 kg/m    Physical Exam  Constitutional: She is oriented to person, place, and time. She appears well-developed and well-nourished.  HENT:  Head: Normocephalic and atraumatic.  Eyes: Conjunctivae and EOM are normal.  Neck: Normal range of motion.  Cardiovascular: Normal rate, regular rhythm, normal heart sounds and intact distal pulses.   Pulmonary/Chest: Effort normal and breath sounds normal.  Abdominal: Soft. Bowel sounds are normal.  Lymphadenopathy:    She has no cervical adenopathy.  Neurological: She is alert and oriented to person, place, and time.  Skin: Skin is warm  and dry. Capillary refill takes less than 2 seconds.  Psychiatric: She has a normal mood and affect. Her behavior is normal.  Nursing note and vitals reviewed.    Musculoskeletal Exam: C-spine and thoracic lumbar spine good range of motion. Shoulder joints elbow joints wrist joint MCPs PIPs with good range of motion. She has some PIP/DIP thickening in her hands and feet consistent with  osteoarthritis. No synovitis was noted.  CDAI Exam: No CDAI exam completed.    Investigation: Findings:  06/23/2017 Antinuclear Antib (ANA) negative ,Cyclic citrul peptide antibody, IgG, Gliadin Antibodies, Serum, Rheumatoid Factor negative, Sedimentation rate negative, Tissue Transglutaminase Abs,IgG,IgA negative, Uric acid negative, and Vitamin D normal 52        Imaging: No results found.  Speciality Comments: No specialty comments available.    Procedures:  No procedures performed Allergies: Patient has no known allergies.   Assessment / Plan:     Visit Diagnoses: Primary osteoarthritis of both hands: She has some osteoarthritic changes in her hands with PIP/DIP thickening. Joint protection and muscle strengthening was discussed. She will start natural anti-inflammatories after her surgery.  Enchondroma of bone of hand, right -  fifth metacarpal: She saw Dr. Burney Gauze and is scheduled for surgery on August 27.  Primary osteoarthritis of both feet - history of plantar fasciitis and possible Achillis tendinitis: She had recent flare of Achillis tendinitis after walking for a while at the beach. The symptoms have resolved now. If she has frequent symptoms she supposed to notify us.  Family history of rheumatoid arthritis - First cousin/ Maternal   History of anemia    Orders: No orders of the defined types were placed in this encounter.  No orders of the defined types were placed in this encounter.     Follow-Up Instructions: Return in about 1 year (around 07/24/2018) for Osteoarthritis.   Bo Merino, MD  Note - This record has been created using Editor, commissioning.  Chart creation errors have been sought, but may not always  have been located. Such creation errors do not reflect on  the standard of medical care.

## 2017-07-24 ENCOUNTER — Ambulatory Visit (INDEPENDENT_AMBULATORY_CARE_PROVIDER_SITE_OTHER): Payer: BC Managed Care – PPO | Admitting: Rheumatology

## 2017-07-24 ENCOUNTER — Encounter: Payer: Self-pay | Admitting: Rheumatology

## 2017-07-24 VITALS — BP 130/62 | HR 72 | Resp 14 | Ht 67.0 in | Wt 162.0 lb

## 2017-07-24 DIAGNOSIS — M19041 Primary osteoarthritis, right hand: Secondary | ICD-10-CM

## 2017-07-24 DIAGNOSIS — D1611 Benign neoplasm of short bones of right upper limb: Secondary | ICD-10-CM

## 2017-07-24 DIAGNOSIS — M19071 Primary osteoarthritis, right ankle and foot: Secondary | ICD-10-CM

## 2017-07-24 DIAGNOSIS — M19072 Primary osteoarthritis, left ankle and foot: Secondary | ICD-10-CM

## 2017-07-24 DIAGNOSIS — M19042 Primary osteoarthritis, left hand: Secondary | ICD-10-CM

## 2017-07-24 DIAGNOSIS — Z862 Personal history of diseases of the blood and blood-forming organs and certain disorders involving the immune mechanism: Secondary | ICD-10-CM

## 2017-07-24 DIAGNOSIS — Z8261 Family history of arthritis: Secondary | ICD-10-CM | POA: Diagnosis not present

## 2017-07-24 NOTE — Patient Instructions (Signed)

## 2017-07-28 ENCOUNTER — Telehealth: Payer: Self-pay | Admitting: Rheumatology

## 2017-07-28 NOTE — Telephone Encounter (Signed)
[facility] requesting [facility] on patient. Please fax to [phone]. Attn: Judson Roch

## 2017-07-29 NOTE — Telephone Encounter (Signed)
Labs faxed to surgical center.

## 2017-08-04 ENCOUNTER — Other Ambulatory Visit: Payer: Self-pay

## 2018-07-16 NOTE — Progress Notes (Deleted)
   Office Visit Note  Patient: Natasha Nash             Date of Birth: Jul 06, 1961           MRN: [id number]             PCP: Kathyrn Drown, MD Referring: Kathyrn Drown, MD Visit Date: 07/23/2018 Occupation: @GUAROCC @  Subjective:  No chief complaint on file.   History of Present Illness: Natasha Nash is a 57 y.o. female ***   Activities of Daily Living:  Patient reports morning stiffness for *** {minute/hour:19697}.   Patient {ACTIONS;DENIES/REPORTS:21021675::"Denies"} nocturnal pain.  Difficulty dressing/grooming: {ACTIONS;DENIES/REPORTS:21021675::"Denies"} Difficulty climbing stairs: {ACTIONS;DENIES/REPORTS:21021675::"Denies"} Difficulty getting out of chair: {ACTIONS;DENIES/REPORTS:21021675::"Denies"} Difficulty using hands for taps, buttons, cutlery, and/or writing: {ACTIONS;DENIES/REPORTS:21021675::"Denies"}  No Rheumatology ROS completed.   PMFS History:  Patient Active Problem List   Diagnosis Date Noted  . Pain in both hands 07/17/2017  . Pain in both feet 07/17/2017  . History of anemia 07/17/2017  . Family history of rheumatoid arthritis 07/17/2017  . Enchondroma of bone of hand, right 07/17/2017  . Iron deficiency anemia 01/02/2016  . Anemia 01/01/2016    Past Medical History:  Diagnosis Date  . Anemia   . Fibroids     No family history on file. Past Surgical History:  Procedure Laterality Date  . TUBAL LIGATION     Social History   Social History Narrative  . Not on file    Objective: Vital Signs: LMP 03/17/2012    Physical Exam   Musculoskeletal Exam: ***  CDAI Exam: CDAI Score: Not documented Patient Global Assessment: Not documented; Provider Global Assessment: Not documented Swollen: Not documented; Tender: Not documented Joint Exam   Not documented   There is currently no information documented on the homunculus. Go to the Rheumatology activity and complete the homunculus joint exam.  Investigation: No additional  findings.  Imaging: No results found.  Recent Labs: Lab Results  Component Value Date   WBC 9.6 06/23/2017   HGB 12.7 06/23/2017   PLT 151 06/23/2017   NA 142 06/23/2017   K 4.3 06/23/2017   CL 107 06/23/2017   CO2 23 06/23/2017   GLUCOSE 79 06/23/2017   BUN 17 06/23/2017   CREATININE 0.75 06/23/2017   BILITOT 0.7 06/23/2017   ALKPHOS 70 06/23/2017   AST 14 06/23/2017   ALT 8 06/23/2017   PROT 6.8 06/23/2017   ALBUMIN 4.4 06/23/2017   CALCIUM 10.1 06/23/2017   GFRAA >89 06/23/2017    Speciality Comments: No specialty comments available.  Procedures:  No procedures performed Allergies: Patient has no known allergies.   Assessment / Plan:     Visit Diagnoses: No diagnosis found.   Orders: No orders of the defined types were placed in this encounter.  No orders of the defined types were placed in this encounter.   Face-to-face time spent with patient was *** minutes. Greater than 50% of time was spent in counseling and coordination of care.  Follow-Up Instructions: No follow-ups on file.   Earnestine Mealing, CMA  Note - This record has been created using Editor, commissioning.  Chart creation errors have been sought, but may not always  have been located. Such creation errors do not reflect on  the standard of medical care.

## 2018-07-23 ENCOUNTER — Ambulatory Visit: Payer: BC Managed Care – PPO | Admitting: Rheumatology

## 2018-08-26 ENCOUNTER — Encounter: Payer: BC Managed Care – PPO | Admitting: Family Medicine

## 2020-08-10 ENCOUNTER — Other Ambulatory Visit: Payer: BC Managed Care – PPO

## 2020-08-10 ENCOUNTER — Other Ambulatory Visit: Payer: Self-pay | Admitting: Critical Care Medicine

## 2020-08-10 DIAGNOSIS — Z20822 Contact with and (suspected) exposure to covid-19: Secondary | ICD-10-CM

## 2020-08-12 LAB — NOVEL CORONAVIRUS, NAA: SARS-CoV-2, NAA: NOT DETECTED

## 2020-08-17 ENCOUNTER — Other Ambulatory Visit: Payer: BC Managed Care – PPO

## 2020-08-17 ENCOUNTER — Other Ambulatory Visit: Payer: Self-pay

## 2020-08-17 DIAGNOSIS — Z20822 Contact with and (suspected) exposure to covid-19: Secondary | ICD-10-CM

## 2020-08-19 LAB — NOVEL CORONAVIRUS, NAA: SARS-CoV-2, NAA: NOT DETECTED

## 2020-08-19 LAB — SARS-COV-2, NAA 2 DAY TAT

## 2020-08-24 ENCOUNTER — Other Ambulatory Visit: Payer: Self-pay

## 2020-08-24 ENCOUNTER — Other Ambulatory Visit: Payer: BC Managed Care – PPO

## 2020-08-24 DIAGNOSIS — Z20822 Contact with and (suspected) exposure to covid-19: Secondary | ICD-10-CM

## 2020-08-26 LAB — NOVEL CORONAVIRUS, NAA: SARS-CoV-2, NAA: NOT DETECTED

## 2020-08-26 LAB — SARS-COV-2, NAA 2 DAY TAT

## 2020-08-31 ENCOUNTER — Other Ambulatory Visit: Payer: BC Managed Care – PPO

## 2020-08-31 DIAGNOSIS — Z20822 Contact with and (suspected) exposure to covid-19: Secondary | ICD-10-CM

## 2020-09-02 LAB — NOVEL CORONAVIRUS, NAA: SARS-CoV-2, NAA: NOT DETECTED

## 2020-09-02 LAB — SARS-COV-2, NAA 2 DAY TAT

## 2020-10-06 ENCOUNTER — Other Ambulatory Visit: Payer: Self-pay

## 2020-10-06 ENCOUNTER — Ambulatory Visit (INDEPENDENT_AMBULATORY_CARE_PROVIDER_SITE_OTHER): Payer: BC Managed Care – PPO | Admitting: Nurse Practitioner

## 2020-10-06 ENCOUNTER — Encounter: Payer: Self-pay | Admitting: Nurse Practitioner

## 2020-10-06 VITALS — BP 132/82 | HR 92 | Temp 97.3°F | Ht 67.0 in | Wt 177.6 lb

## 2020-10-06 DIAGNOSIS — G8929 Other chronic pain: Secondary | ICD-10-CM | POA: Diagnosis not present

## 2020-10-06 DIAGNOSIS — M25511 Pain in right shoulder: Secondary | ICD-10-CM | POA: Diagnosis not present

## 2020-10-06 NOTE — Progress Notes (Signed)
   Subjective:    Patient ID: Natasha Nash, female    DOB: 06/14/1961, 59 y.o.   MRN: [id number]  HPI Patient reports intermittent dull achy right shoulder pain since August, similar to bursitis she has had in the past.    Review of Systems     Objective:   Physical Exam        Assessment & Plan:

## 2020-10-06 NOTE — Patient Instructions (Addendum)
Conservative treatment for 2 weeks: Shoulder exercises and stretching Anti-Inflammatory PRN pain Lidocaine Patch, Biofreeze, or Voltaren for Pain Apply heat PRN If no improvement schedule appt with [facility]: [facility] @[phone]  Schedule Physical  Shoulder Range of Motion Exercises Shoulder range of motion (ROM) exercises are done to keep the shoulder moving freely or to increase movement. They are often recommended for people who have shoulder pain or stiffness or who are recovering from a shoulder surgery. Phase 1 exercises When you are able, do this exercise 1-2 times per day for 30-60 seconds in each direction, or as directed by your health care provider. Pendulum exercise To do this exercise while sitting: 1. Sit in a chair or at the edge of your bed with your feet flat on the floor. 2. Let your affected arm hang down in front of you over the edge of the bed or chair. 3. Relax your shoulder, arm, and hand. [place] your body so your arm gently swings in small circles. You can also use your unaffected arm to start the motion. 5. Repeat changing the direction of the circles, swinging your arm left and right, and swinging your arm forward and back. To do this exercise while standing: 1. Stand next to a sturdy chair or table, and hold on to it with your hand on your unaffected side. 2. Bend forward at the waist. 3. Bend your knees slightly. 4. Relax your shoulder, arm, and hand. 5. While keeping your shoulder relaxed, use body motion to swing your arm in small circles. 6. Repeat changing the direction of the circles, swinging your arm left and right, and swinging your arm forward and back. 7. Between exercises, stand up tall and take a short break to relax your lower back.  Phase 2 exercises Do these exercises 1-2 times per day or as told by your health care provider. Hold each stretch for 30 seconds, and repeat 3 times. Do the exercises with one or both arms  as instructed by your health care provider. For these exercises, sit at a table with your hand and arm supported by the table. A chair that slides easily or has wheels can be helpful. External rotation 1. Turn your chair so that your affected side is nearest to the table. 2. Place your forearm on the table to your side. Bend your elbow about 90 at the elbow (right angle) and place your hand palm facing down on the table. Your elbow should be about 6 inches away from your side. 3. Keeping your arm on the table, lean your body forward. Abduction 1. Turn your chair so that your affected side is nearest to the table. 2. Place your forearm and hand on the table so that your thumb points toward the ceiling and your arm is straight out to your side. 3. Slide your hand out to the side and away from you, using your unaffected arm to do the work. 4. To increase the stretch, you can slide your chair away from the table. Flexion: forward stretch 1. Sit facing the table. Place your hand and elbow on the table in front of you. 2. Slide your hand forward and away from you, using your unaffected arm to do the work. 3. To increase the stretch, you can slide your chair backward. Phase 3 exercises Do these exercises 1-2 times per day or as told by your health care provider. Hold each stretch for 30 seconds, and repeat 3 times. Do the exercises with  one or both arms as instructed by your health care provider. Cross-body stretch: posterior capsule stretch 1. Lift your arm straight out in front of you. 2. Bend your arm 90 at the elbow (right angle) so your forearm moves across your body. 3. Use your other arm to gently pull the elbow across your body, toward your other shoulder. Wall climbs 1. Stand with your affected arm extended out to the side with your hand resting on a door frame. 2. Slide your hand slowly up the door frame. 3. To increase the stretch, step through the door frame. Keep your body upright and  do not lean. Wand exercises You will need a cane, a piece of PVC pipe, or a sturdy wooden dowel for wand exercises. Flexion To do this exercise while standing: 1. Hold the wand with both of your hands, palms down. 2. Using the other arm to help, lift your arms up and over your head, if able. 3. Push upward with your other arm to gently increase the stretch. To do this exercise while lying down: 1. Lie on your back with your elbows resting on the floor and the wand in both your hands. Your hands will be palm down, or pointing toward your feet. 2. Lift your hands toward the ceiling, using your unaffected arm to help if needed. 3. Bring your arms overhead as able, using your unaffected arm to help if needed. Internal rotation 1. Stand while holding the wand behind you with both hands. Your unaffected arm should be extended above your head with the arm of the affected side extended behind you at the level of your waist. The wand should be pointing straight up and down as you hold it. 2. Slowly pull the wand up behind your back by straightening the elbow of your unaffected arm and bending the elbow of your affected arm. External rotation 1. Lie on your back with your affected upper arm supported on a small pillow or rolled towel. When you first do this exercise, keep your upper arm close to your body. Over time, bring your arm up to a 90 angle out to the side. 2. Hold the wand across your stomach and with both hands palm up. Your elbow on your affected side should be bent at a 90 angle. 3. Use your unaffected side to help push your forearm away from you and toward the floor. Keep your elbow on your affected side bent at a 90 angle. Contact a health care provider if you have:  New or increasing pain.  New numbness, tingling, weakness, or discoloration in your arm or hand. This information is not intended to replace advice given to you by your health care provider. Make sure you discuss any  questions you have with your health care provider. Document Revised: 01/07/2018 Document Reviewed: 01/07/2018 Elsevier Patient Education  2020 [employer].

## 2020-10-06 NOTE — Progress Notes (Signed)
Subjective:    Subjective   Patient ID: Natasha Nash, female    DOB: 1961/02/23, 59 y.o.   MRN: [id number]  HPI Patient reports intermittent dull achy right shoulder pain since mid August, similar to bursitis pain diagnosed by Rheumatology in 2004. She denies sleep disturbance or changes in ROM or abilities to preform ADL's. She notes ibuprofen OTC at night helps her to sleep best. She confirms joint stiffness to right shoulder upon awakening some mornings. 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  Previous xray in 2004 by Rheumatology was positive for spurring of acromion. At that time she was prescribed Vitamin D 50,000 units which she felt improved her symptoms. 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 Patient was later released from ortho and rheumatology specialty following surgery. Past familial history significant for rheumatoid arthritis.   Review of Systems  Review of Systems  Constitutional: Negative for chills and fever.  Respiratory: Negative for cough, shortness of breath and wheezing.   Cardiovascular: Negative for chest pain, palpitations, orthopnea and leg swelling.  Musculoskeletal: Positive for joint pain. Negative for back pain, falls and neck pain.       Right shoulder pain  (not present at visit)   PHQ9 SCORE ONLY 10/06/2020  PHQ-9 Total Score 0       Objective:   Objective   Today's Vitals   10/06/20 1352  BP: 132/82  Pulse: 92  Temp: (!) 97.3 F (36.3 C)  SpO2: 96%  Weight: 177 lb 9.6 oz (80.6 kg)  Height: 5\' 7"  (1.702 m)   Body mass index is 27.82 kg/m.   Physical Exam Physical Exam Constitutional:      General: She is not in acute distress.    Appearance: Normal appearance. She is not ill-appearing.  Cardiovascular:     Rate and Rhythm: Normal rate and regular  rhythm.  Pulmonary:     Effort: Pulmonary effort is normal. No respiratory distress.  Musculoskeletal:        General: No deformity or signs of injury.     Right shoulder: Tenderness present. No swelling, deformity or crepitus. Normal range of motion. Normal strength. Normal pulse.     Left shoulder: Normal.     Cervical back: Normal range of motion. No tenderness.     Comments: Right ACV joint tenderness upon palpation, adduction and rotation. Stiffness to palpation. Strength WNL  Skin:    General: Skin is warm and dry.     Capillary Refill: Capillary refill takes 2 to 3 seconds.  Neurological:     Mental Status: She is alert and oriented to person, place, and time.     Motor: No weakness.  Psychiatric:        Mood and Affect: Mood normal.    Tightness and tenderness noted with palpation of the upper deltoid near the [facility] joint.       Assessment & Plan:  Chronic right shoulder pain   Discussed symptoms of tendonitis and treatment options. Conservative treatment for 2 weeks: Shoulder exercises and stretching provided to patient for alleviate joint stiffness Anti-Inflammatory OTC PRN for pain  Lidocaine Patch, Biofreeze, or Voltaren for pain Apply heat PRN stiffness or pain If no improvement patient should schedule appt with [facility]. Number provided. Recommended patient to schedule physical as soon as possible.  Return in about 2 months (around 12/06/2020)  for Physical.

## 2020-10-09 ENCOUNTER — Encounter: Payer: Self-pay | Admitting: Nurse Practitioner

## 2021-01-23 ENCOUNTER — Encounter: Payer: Self-pay | Admitting: Family Medicine

## 2021-01-23 ENCOUNTER — Other Ambulatory Visit (HOSPITAL_COMMUNITY)
Admission: RE | Admit: 2021-01-23 | Discharge: 2021-01-23 | Disposition: A | Discharge status: Home / Self Care | Payer: BC Managed Care – PPO | Source: Ambulatory Visit | Attending: Family Medicine | Admitting: Family Medicine

## 2021-01-23 ENCOUNTER — Other Ambulatory Visit: Payer: Self-pay

## 2021-01-23 ENCOUNTER — Ambulatory Visit (INDEPENDENT_AMBULATORY_CARE_PROVIDER_SITE_OTHER): Payer: BC Managed Care – PPO | Admitting: Family Medicine

## 2021-01-23 ENCOUNTER — Ambulatory Visit (HOSPITAL_COMMUNITY)
Admission: RE | Admit: 2021-01-23 | Discharge: 2021-01-23 | Disposition: A | Discharge status: Home / Self Care | Payer: BC Managed Care – PPO | Source: Ambulatory Visit | Attending: Family Medicine | Admitting: Family Medicine

## 2021-01-23 VITALS — BP 130/78 | HR 65 | Temp 97.7°F | Wt 178.8 lb

## 2021-01-23 DIAGNOSIS — M79651 Pain in right thigh: Secondary | ICD-10-CM

## 2021-01-23 LAB — D-DIMER, QUANTITATIVE: D-Dimer, Quant: <0.27 ug/mL-FEU (ref 0.00–0.50)

## 2021-01-23 MED ORDER — NAPROXEN 500 MG PO TABS: 500.0000 mg | ORAL_TABLET | Freq: Two times a day (BID) | ORAL | 0 refills | Status: DC

## 2021-01-23 NOTE — Progress Notes (Signed)
Pt having right hip pain. Began on 01/12/20 and radiates to pelvic area. Radiates to back and thigh area at times. No OTC meds tried at this time.     Patient ID: Natasha Nash, female    DOB: 1961-11-17, 60 y.o.   MRN: [id number]   Chief Complaint  Patient presents with  . Hip Pain   Subjective:  CC: low back pain and right upper thigh pain  This is a new problem.  Presents today for an acute visit with a complaint of right hip pain.  Reports that pain is in low back and upper thigh area.  Symptoms started on February 2, when she was positive for Covid.  Reports that she was working from home due to Covid infection, sitting in a straight-backed chair when pain began.  She denies any numbness, tingling, or radiating pain.  Reports that she does have pain in different areas of her body, was last seen at this office for shoulder pain.  Reports that she has a strong history of blood clot and stroke in her family, concerned that this could be a stroke in her right upper extremity.  Denies fever, chills, chest pain, shortness of breath.    Medical History Jadaya has a past medical history of Anemia and Fibroids.   Outpatient Encounter Medications as of 01/23/2021  Medication Sig  . naproxen (NAPROSYN) 500 MG tablet Take 1 tablet (500 mg total) by mouth 2 (two) times daily with a meal.  . Cholecalciferol (VITAMIN D PO) Take by mouth. 5,000 units daily   No facility-administered encounter medications on file as of 01/23/2021.     Review of Systems  Constitutional: Negative for chills and fever.  Respiratory: Negative for shortness of breath.   Cardiovascular: Negative for chest pain.  Gastrointestinal: Negative for abdominal pain.  Musculoskeletal: Positive for back pain. Negative for joint swelling.       Right low back and thigh pain. No weakness, no numbness, no radiating pain. Symptoms started with Covid infection on 2/3.      Vitals BP 130/78   Pulse 65   Temp 97.7 F (36.5 C)   Wt  178 lb 12.8 oz (81.1 kg)   LMP 03/17/2012   SpO2 100%   BMI 28.00 kg/m   Objective:   Physical Exam Vitals reviewed.  Constitutional:      Appearance: Normal appearance.  Cardiovascular:     Rate and Rhythm: Normal rate and regular rhythm.     Heart sounds: Normal heart sounds.  Pulmonary:     Effort: Pulmonary effort is normal.     Breath sounds: Normal breath sounds.  Musculoskeletal:     Comments: 5/5 upper and lower body strength. Negative straight leg rarea. aise. Pain in upper thigh/groin   Skin:    General: Skin is warm and dry.  Neurological:     General: No focal deficit present.     Mental Status: She is alert.  Psychiatric:        Behavior: Behavior normal.      Assessment and Plan   1. Pain of right lateral upper thigh - [place] Lower Ext Art Right Ltd - D-Dimer, Quantitative - naproxen (NAPROSYN) 500 MG tablet; Take 1 tablet (500 mg total) by mouth 2 (two) times daily with a meal.  Dispense: 28 tablet; Refill: 0   Due to strong family history of blood clot and stroke, will send to [facility] for stat D-dimer and right upper leg/thigh/groin ultrasound.  Patient positive for  Covid when symptoms started.  Update: D-dimer negative: 0.27. Ultrasound negative- patient notified.   If negative for DVT, will treat conservatively with naproxen twice per day with food.  She does not have a history of cardiac, GI bleed or hypertension.  Agrees with plan of care discussed today. Understands warning signs to seek further care: chest pain, shortness of breath, any significant change in health.  Understands to follow-up in one month. Consider ortho referral at that time.     Chalmers Guest, NP 01/23/2021

## 2021-01-24 ENCOUNTER — Telehealth: Payer: Self-pay | Admitting: Family Medicine

## 2021-01-24 NOTE — Telephone Encounter (Signed)
Chalmers Guest, NP  Vicente Males, LPN Please notify Modena Nunnery. I sent Naproxen and to please follow-up in one month if symptoms are not improved.  Thanks, KD

## 2021-01-24 NOTE — Telephone Encounter (Signed)
Left message to return call 

## 2021-01-24 NOTE — Telephone Encounter (Signed)
Patient notified per Santiago Glad NP:  Naproxen sent to pharmacy,  follow-up in one month if symptoms are not improved. Patient verbalized understanding.

## 2021-05-10 ENCOUNTER — Ambulatory Visit
Admission: EM | Admit: 2021-05-10 | Discharge: 2021-05-10 | Disposition: A | Discharge status: Home / Self Care | Payer: BC Managed Care – PPO | Attending: Internal Medicine | Admitting: Internal Medicine

## 2021-05-10 ENCOUNTER — Encounter: Payer: Self-pay | Admitting: Emergency Medicine

## 2021-05-10 ENCOUNTER — Other Ambulatory Visit: Payer: Self-pay

## 2021-05-10 DIAGNOSIS — L03115 Cellulitis of right lower limb: Secondary | ICD-10-CM | POA: Diagnosis not present

## 2021-05-10 DIAGNOSIS — T7840XA Allergy, unspecified, initial encounter: Secondary | ICD-10-CM | POA: Diagnosis not present

## 2021-05-10 DIAGNOSIS — S80861A Insect bite (nonvenomous), right lower leg, initial encounter: Secondary | ICD-10-CM | POA: Diagnosis not present

## 2021-05-10 DIAGNOSIS — W57XXXA Bitten or stung by nonvenomous insect and other nonvenomous arthropods, initial encounter: Secondary | ICD-10-CM | POA: Diagnosis not present

## 2021-05-10 MED ORDER — CEPHALEXIN 500 MG PO CAPS
500.0000 mg | ORAL_CAPSULE | Freq: Three times a day (TID) | ORAL | 0 refills | Status: DC
Start: 2021-05-10 — End: 2022-11-27

## 2021-05-10 MED ORDER — PREDNISONE 20 MG PO TABS: 20.0000 mg | ORAL_TABLET | Freq: Every day | ORAL | 0 refills | Status: DC

## 2021-05-10 NOTE — ED Provider Notes (Signed)
[facility]    CSN: [id number] Arrival date & time: 05/10/21  1036      History   Chief Complaint No chief complaint on file.   HPI Natasha Nash is a 60 y.o. female who presents with with R lower leg redness and swelling since stan by a bee 2 days ago. Has been applying cortisone cream and taking benadryl for the itching. Then she noticed this area getting hot and larger in redness.     Past Medical History:  Diagnosis Date  . Anemia   . Fibroids     Patient Active Problem List   Diagnosis Date Noted  . Pain of right lateral upper thigh 01/23/2021  . Pain in both hands 07/17/2017  . Pain in both feet 07/17/2017  . History of anemia 07/17/2017  . Family history of rheumatoid arthritis 07/17/2017  . Enchondroma of bone of hand, right 07/17/2017  . Iron deficiency anemia 01/02/2016  . Anemia 01/01/2016    Past Surgical History:  Procedure Laterality Date  . TUBAL LIGATION      OB History   No obstetric history on file.      Home Medications    Prior to Admission medications   Medication Sig Start Date End Date Taking? Authorizing Provider  cephALEXin (KEFLEX) 500 MG capsule Take 1 capsule (500 mg total) by mouth 3 (three) times daily. 05/10/21  Yes Rodriguez-Southworth, Sunday Spillers, PA-C  predniSONE (DELTASONE) 20 MG tablet Take 1 tablet (20 mg total) by mouth daily with breakfast. For allergic reaction 05/10/21  Yes Rodriguez-Southworth, Sunday Spillers, PA-C  Cholecalciferol (VITAMIN D PO) Take by mouth. 5,000 units daily    [provider]  naproxen (NAPROSYN) 500 MG tablet Take 1 tablet (500 mg total) by mouth 2 (two) times daily with a meal. 01/23/21   Chalmers Guest, NP    Family History Family History  Problem Relation Age of Onset  . Stroke Mother   . Heart disease Father     Social History Social History   Tobacco Use  . Smoking status: Never Smoker  . Smokeless tobacco: Never Used  Substance Use Topics  . Alcohol use: No  . Drug use:  No     Allergies   Patient has no known allergies.   Review of Systems Review of Systems   Physical Exam Triage Vital Signs ED Triage Vitals  Enc Vitals Group     BP 05/10/21 1104 (!) 144/75     Pulse Rate 05/10/21 1104 68     Resp 05/10/21 1104 16     Temp 05/10/21 1104 98.3 F (36.8 C)     Temp Source 05/10/21 1104 Oral     SpO2 05/10/21 1104 98 %     Weight --      Height --      Head Circumference --      Peak Flow --      Pain Score 05/10/21 1110 0     Pain Loc --      Pain Edu? --      Excl. in [place]? --    No data found.  Updated Vital Signs BP (!) 144/75 (BP Location: Right Arm)   Pulse 68   Temp 98.3 F (36.8 C) (Oral)   Resp 16   LMP 03/17/2012   SpO2 98%   Visual Acuity Right Eye Distance:   Left Eye Distance:   Bilateral Distance:    Right Eye Near:   Left Eye Near:  Bilateral Near:     Physical Exam Vitals and nursing note reviewed.  Constitutional:      General: She is not in acute distress.    Appearance: She is normal weight. She is not toxic-appearing.  HENT:     Head: Normocephalic.     Right Ear: External ear normal.     Left Ear: External ear normal.  Eyes:     General: No scleral icterus.    Conjunctiva/sclera: Conjunctivae normal.  Pulmonary:     Effort: Pulmonary effort is normal.  Musculoskeletal:        General: Normal range of motion.     Cervical back: Neck supple.  Skin:    General: Skin is warm and dry.     Findings: Erythema present.     Comments: R LOWER LEG- has large area of erythema of about 2/3 of her lateral calf with warmth, but no streaking. Stinger not seen.   Neurological:     Mental Status: She is alert and oriented to person, place, and time.     Gait: Gait normal.  Psychiatric:        Mood and Affect: Mood normal.        Behavior: Behavior normal.        Thought Content: Thought content normal.        Judgment: Judgment normal.      UC Treatments / Results  Labs (all labs ordered are  listed, but only abnormal results are displayed) Labs Reviewed - No data to display  EKG   Radiology No results found.  Procedures Procedures (including critical care time)  Medications Ordered in UC Medications - No data to display  Initial Impression / Assessment and Plan / UC Course  I have reviewed the triage vital signs and the nursing notes. Insect bite R lower leg with local reaction and cellulitis. I placed her on Keflex and Prednisone as noted.  Final Clinical Impressions(s) / UC Diagnoses   Final diagnoses:  Cellulitis of leg, right  Allergic reaction, initial encounter  Insect bite of right lower extremity, initial encounter   Discharge Instructions   None    ED Prescriptions    Medication Sig Dispense Auth. Provider   predniSONE (DELTASONE) 20 MG tablet Take 1 tablet (20 mg total) by mouth daily with breakfast. For allergic reaction 5 tablet Rodriguez-Southworth, Sunday Spillers, PA-C   cephALEXin (KEFLEX) 500 MG capsule Take 1 capsule (500 mg total) by mouth 3 (three) times daily. 21 capsule Rodriguez-Southworth, Sunday Spillers, PA-C     PDMP not reviewed this encounter.   Shelby Mattocks, [place] 05/10/21 1752

## 2021-05-10 NOTE — ED Triage Notes (Signed)
bee sting on Tuesday.  Area is red, swollen, and hard.  States area itches.  Has been taking benadryl and cortisone cream.

## 2021-05-16 IMAGING — US US EXTREM LOW DUPLEX ARTERIAL*R* LIMITED
1 series · 14 of 25 positions shown · non-contrast
Comparison: None.

CLINICAL DATA: 59-year-old with right lateral upper thigh pain.

EXAM:
RIGHT LOWER EXTREMITY ARTERIAL DUPLEX SCAN - LIMITED
TECHNIQUE: Gray-scale sonography as well as color Doppler and duplex ultrasound
was performed to evaluate the lower extremity arteries including the
common, superficial and profunda femoral arteries, popliteal artery
and calf arteries.

[Series 1: us lower ext art right ltd · 14 of 33 slices shown]
[im 1/33]
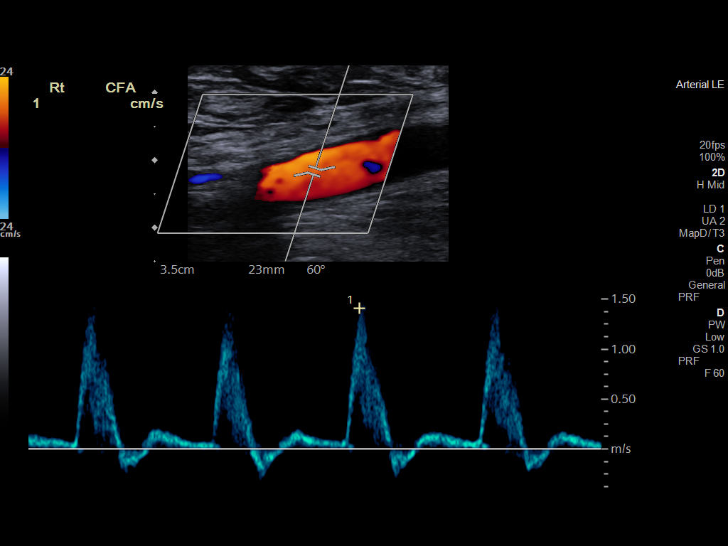
[im 3/33]
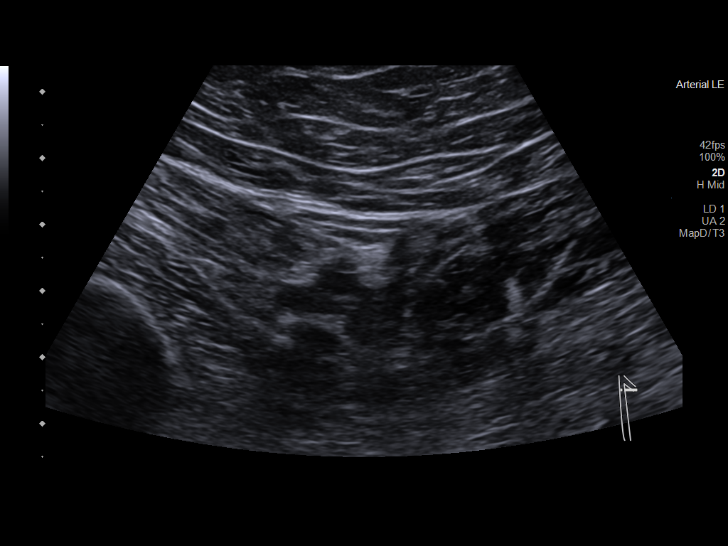
[im 6/33]
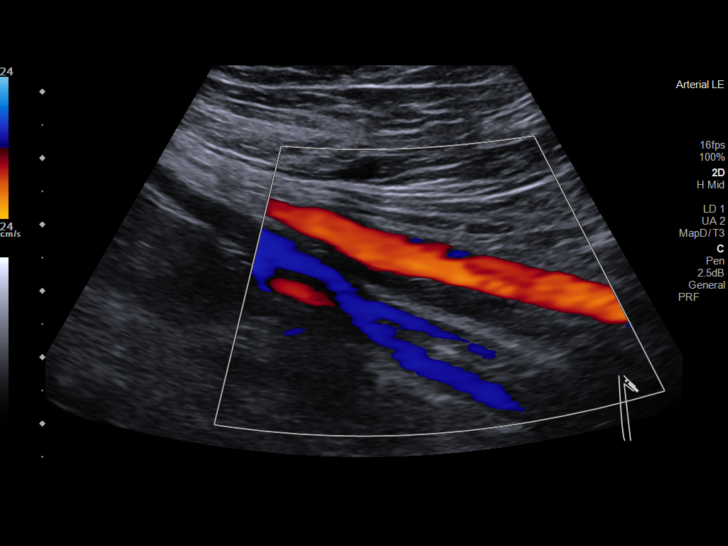
[im 9/33]
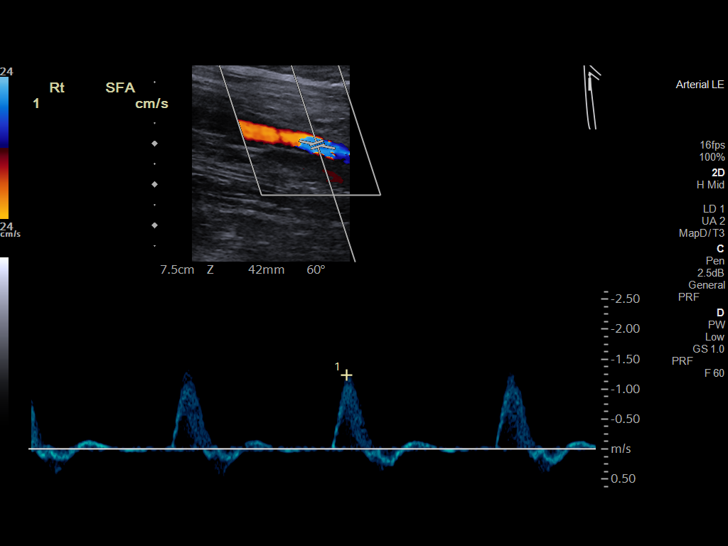
[im 11/33]
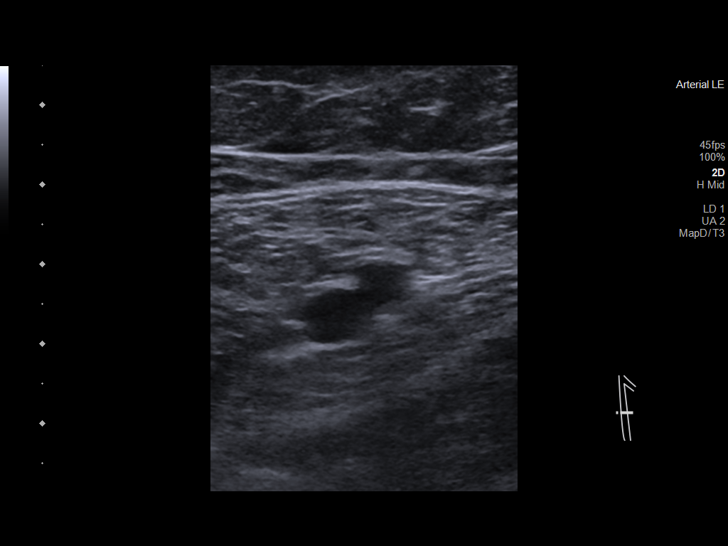
[im 13/33]
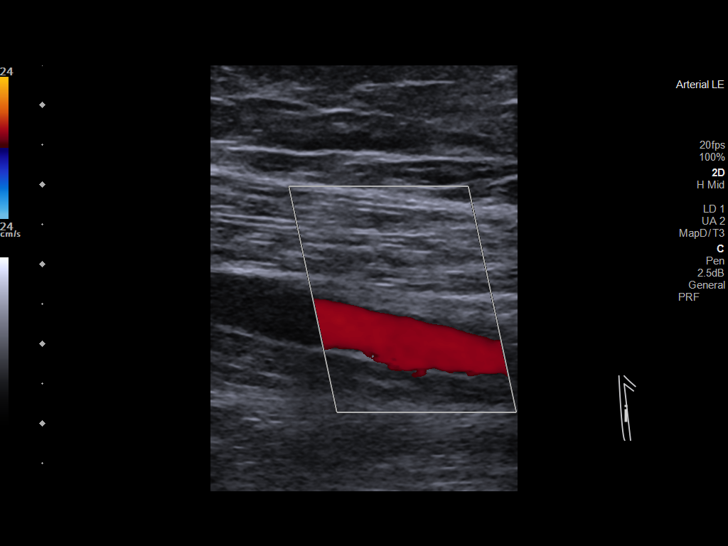
[im 15/33]
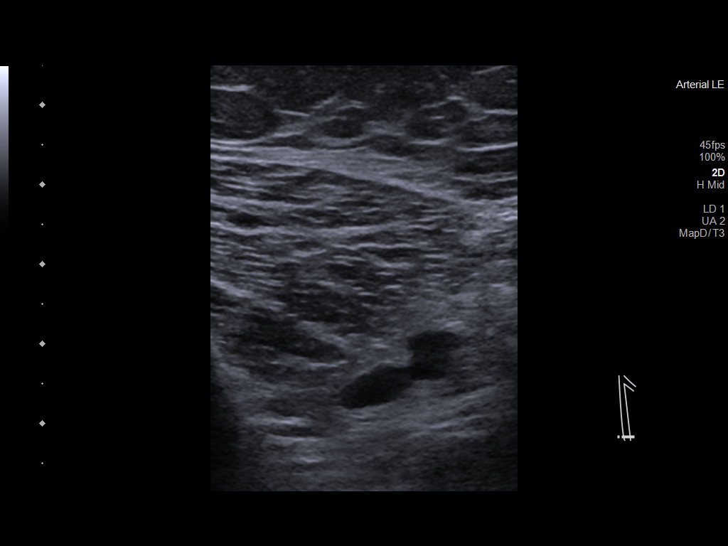
[im 18/33]
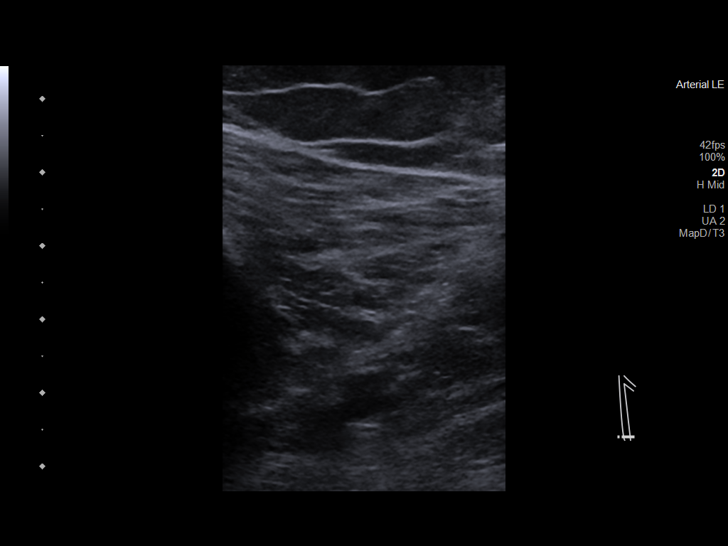
[im 21/33]
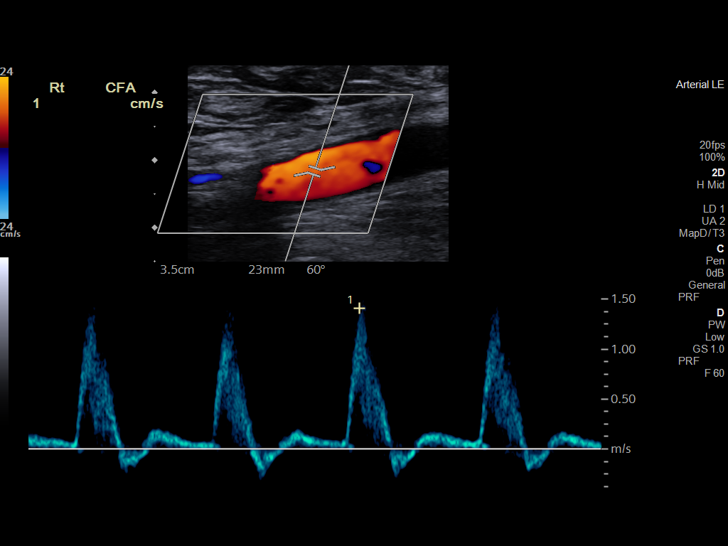
[im 22/33]
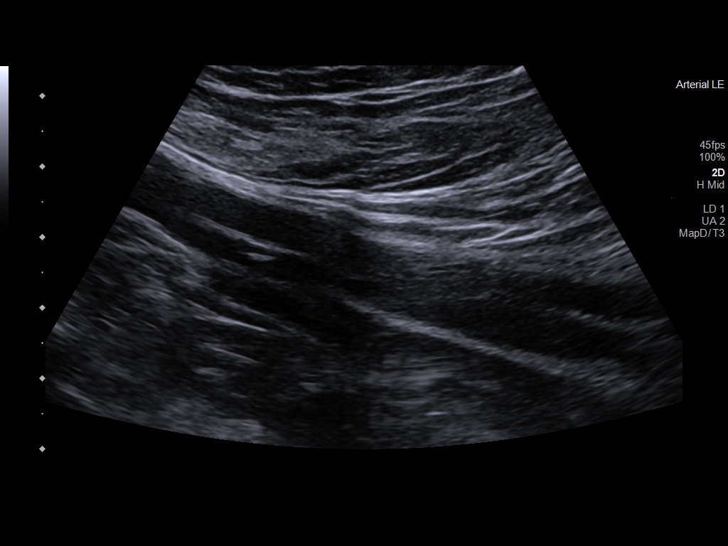
[im 25/33]
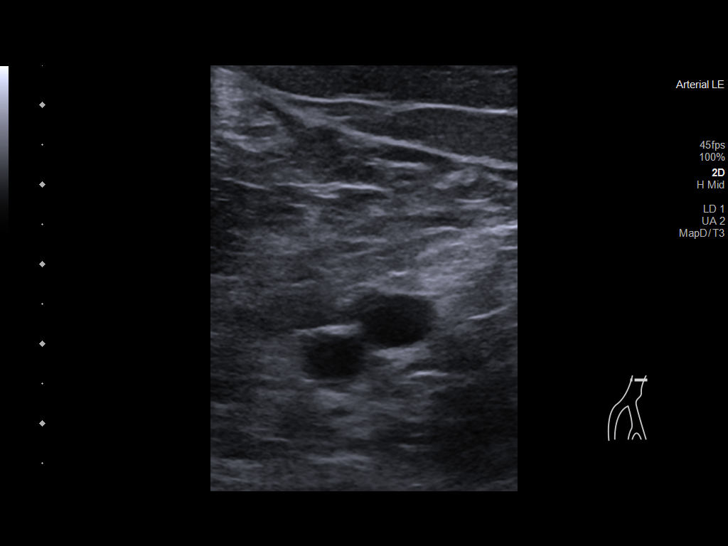
[im 27/33]
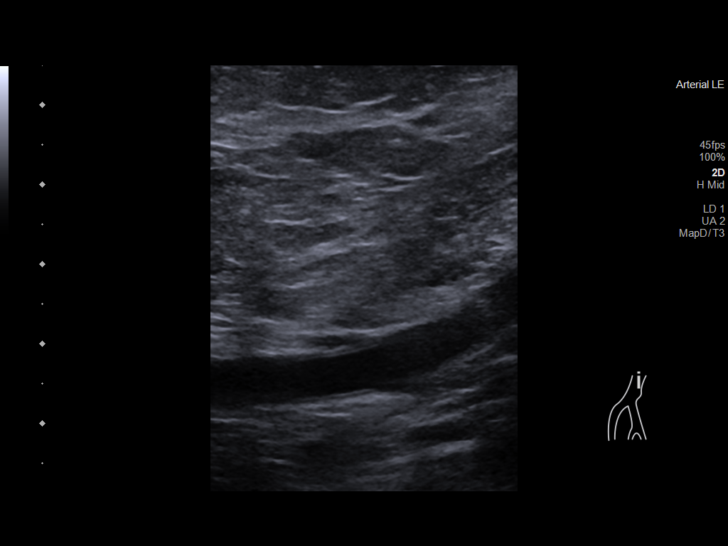
[im 30/33]
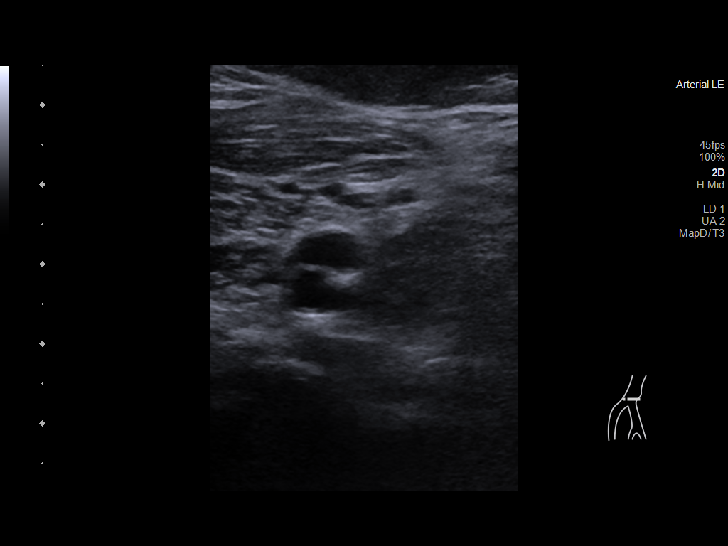
[im 33/33]
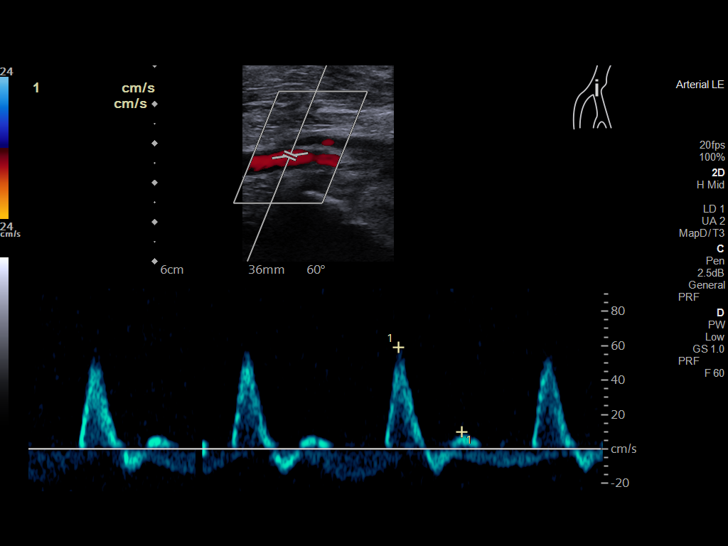

[14 of 25 positions shown; findings below may reference images not displayed]

FINDINGS: Right lower Extremity

ABI: Not obtained

Inflow: Normal common femoral arterial waveforms and velocities. No
evidence of inflow (aortoiliac) disease.

Outflow: Normal profunda femoral, superficial femoral and popliteal
arterial waveforms and velocities. No focal elevation of the PSV to
suggest stenosis.

Runoff: Not evaluated.
IMPRESSION: Right common femoral artery, right superficial femoral artery and
right popliteal artery are patent with normal velocities and
waveforms. No evidence for stenosis.

## 2022-11-27 ENCOUNTER — Ambulatory Visit: Payer: BC Managed Care – PPO | Admitting: Family Medicine

## 2022-11-27 ENCOUNTER — Encounter (INDEPENDENT_AMBULATORY_CARE_PROVIDER_SITE_OTHER): Payer: Self-pay | Admitting: *Deleted

## 2022-11-27 VITALS — BP 112/64 | HR 76 | Temp 98.1°F | Ht 67.0 in | Wt 179.0 lb

## 2022-11-27 DIAGNOSIS — R0789 Other chest pain: Secondary | ICD-10-CM

## 2022-11-27 DIAGNOSIS — Z13220 Encounter for screening for lipoid disorders: Secondary | ICD-10-CM

## 2022-11-27 DIAGNOSIS — Z1211 Encounter for screening for malignant neoplasm of colon: Secondary | ICD-10-CM

## 2022-11-27 MED ORDER — PANTOPRAZOLE SODIUM 40 MG PO TBEC: 40.0000 mg | DELAYED_RELEASE_TABLET | Freq: Every day | ORAL | 3 refills | Status: DC

## 2022-11-27 NOTE — Patient Instructions (Signed)

## 2022-11-27 NOTE — Progress Notes (Addendum)
   Subjective:    Patient ID: Natasha Nash, female    DOB: 12-05-1961, 61 y.o.   MRN: [id number]  HPI Tightness and discomfort in sternum intermittent and not related to any other factors x 3 weeks No reported injuries She describes the symptoms as being a burning sensation mid sternum region last for 10 to 15 minutes at a time can often occur with little to no activity She does relate in the morning time she feels a better fluid phlegm like sensation in the back of her throat Denies any what she refers to as heartburn during the day No nausea or vomiting Energy level okay Earlier this year would do a lot of walking cannot have any chest pressure tightness or shortness of breath  She is behind on preventative health Did talk with her today about mammogram female health check up colonoscopy versus Cologuard  Review of Systems     Objective:   Physical Exam  General-in no acute distress Eyes-no discharge Lungs-respiratory rate normal, CTA CV-no murmurs,RRR Extremities skin warm dry no edema Neuro grossly normal Behavior normal, alert Abdomen no tenderness nurse present Sternum nontender to palpation      Assessment & Plan:  EKG no acute changes Mammogram phone number given Cologuard ordered Female health exam recommended Given bitter sensation she gets in the back of her throat more than likely this is related to reflux Recommend medication Follow-up if progressive troubles or problems Patient does not have angina when she walks No hematemesis.  No shortness of breath.  No tenderness on exam.  Do not feel x-rays indicated. We will do a follow-up in approximately 4 to 6 weeks to see how she is doing Notify us sooner if problems  Patient also with musculoskeletal chest pain Screening hyperlipidemia Labs for wellness

## 2022-11-30 LAB — BASIC METABOLIC PANEL
BUN/Creatinine Ratio: 18 (ref 9–23)
BUN: 11 mg/dL (ref 6–24)
CO2: 26 mmol/L (ref 20–29)
Calcium: 9.9 mg/dL (ref 8.7–10.2)
Chloride: 104 mmol/L (ref 96–106)
Creatinine, Ser: 0.6 mg/dL (ref 0.57–1.00)
Glucose: 86 mg/dL (ref 70–99)
Potassium: 4.6 mmol/L (ref 3.5–5.2)
Sodium: 140 mmol/L (ref 134–144)
eGFR: 103 mL/min/{1.73_m2} (ref >59)

## 2022-11-30 LAB — HEPATIC FUNCTION PANEL
ALT: 12 IU/L (ref 0–32)
AST: 18 IU/L (ref 0–40)
Albumin: 4.3 g/dL (ref 3.8–4.9)
Alkaline Phosphatase: 84 IU/L (ref 44–121)
Bilirubin Total: 0.4 mg/dL (ref 0.0–1.2)
Bilirubin, Direct: 0.14 mg/dL (ref 0.00–0.40)
Total Protein: 6.5 g/dL (ref 6.0–8.5)

## 2022-11-30 LAB — LIPID PANEL
Chol/HDL Ratio: 2.4 ratio (ref 0.0–4.4)
Cholesterol, Total: 174 mg/dL (ref 100–199)
HDL: 73 mg/dL (ref >39)
LDL Chol Calc (NIH): 93 mg/dL (ref 0–99)
Triglycerides: 39 mg/dL (ref 0–149)
VLDL Cholesterol Cal: 8 mg/dL (ref 5–40)

## 2023-01-08 ENCOUNTER — Ambulatory Visit: Payer: BC Managed Care – PPO | Admitting: Family Medicine

## 2023-02-11 ENCOUNTER — Other Ambulatory Visit: Payer: Self-pay | Admitting: Family Medicine

## 2023-02-11 DIAGNOSIS — Z1231 Encounter for screening mammogram for malignant neoplasm of breast: Secondary | ICD-10-CM

## 2023-09-17 ENCOUNTER — Encounter (HOSPITAL_COMMUNITY): Payer: Self-pay

## 2023-09-17 ENCOUNTER — Emergency Department (HOSPITAL_COMMUNITY)
Admission: EM | Admit: 2023-09-17 | Discharge: 2023-09-17 | Disposition: A | Discharge status: Home / Self Care | Payer: BC Managed Care – PPO | Attending: Emergency Medicine | Admitting: Emergency Medicine

## 2023-09-17 ENCOUNTER — Emergency Department (HOSPITAL_COMMUNITY): Payer: BC Managed Care – PPO

## 2023-09-17 ENCOUNTER — Other Ambulatory Visit: Payer: Self-pay

## 2023-09-17 DIAGNOSIS — W01198A Fall on same level from slipping, tripping and stumbling with subsequent striking against other object, initial encounter: Secondary | ICD-10-CM | POA: Insufficient documentation

## 2023-09-17 DIAGNOSIS — S0990XA Unspecified injury of head, initial encounter: Secondary | ICD-10-CM | POA: Diagnosis present

## 2023-09-17 DIAGNOSIS — T148XXA Other injury of unspecified body region, initial encounter: Secondary | ICD-10-CM

## 2023-09-17 DIAGNOSIS — Y990 Civilian activity done for income or pay: Secondary | ICD-10-CM | POA: Diagnosis not present

## 2023-09-17 DIAGNOSIS — S0093XA Contusion of unspecified part of head, initial encounter: Secondary | ICD-10-CM | POA: Insufficient documentation

## 2023-09-17 DIAGNOSIS — W19XXXA Unspecified fall, initial encounter: Secondary | ICD-10-CM

## 2023-09-17 MED ORDER — ACETAMINOPHEN 325 MG PO TABS
650.0000 mg | ORAL_TABLET | Freq: Once | ORAL | Status: AC
  Administered 2023-09-17: 650 mg via ORAL
  Filled 2023-09-17: qty 2

## 2023-09-17 NOTE — Discharge Instructions (Addendum)
We saw you in the ER after you had a fall. All the imaging results are normal, no fractures seen. No evidence of brain bleed. Please be very careful with walking, and do everything possible to prevent falls. Follow-up with your primary care doctor 1 week for reevaluation Return to the emergency department for severe headaches, repeated falls or any other concerns

## 2023-09-17 NOTE — ED Triage Notes (Signed)
Pt was walking and tripped over a something and fell hitting her head on a cement parking block. Pt denies any LOC, no blood thinners, denies any neck or back pain.

## 2023-09-17 NOTE — ED Provider Notes (Signed)
[place] EMERGENCY DEPARTMENT AT [facility] Provider Note   CSN: [id number] Arrival date & time: 09/17/23  1235     History  Chief Complaint  Patient presents with   Natasha Nash is a 63 y.o. female.  HPI    62 year old female comes in with chief complaint of mechanical fall. Patient indicates that she was walking and tripped over something, in the process falling and hitting her head on the cement block.  She does not think she lost consciousness.  Patient has a headache.  Patient accompanied by daughter, was at the bedside. Home Medications Prior to Admission medications   Medication Sig Start Date End Date Taking? Authorizing Provider  Cholecalciferol (VITAMIN D PO) Take by mouth. 5,000 units daily Patient not taking: Reported on 11/27/2022    [provider]  naproxen (NAPROSYN) 500 MG tablet Take 1 tablet (500 mg total) by mouth 2 (two) times daily with a meal. Patient not taking: Reported on 11/27/2022 01/23/21   Novella Olive, FNP  pantoprazole (PROTONIX) 40 MG tablet Take 1 tablet (40 mg total) by mouth daily. 11/27/22   Babs Sciara, MD      Allergies    Patient has no known allergies.    Review of Systems   Review of Systems  All other systems reviewed and are negative.   Physical Exam Updated Vital Signs BP 135/79   Pulse (!) 59   Temp 98.2 F (36.8 C) (Oral)   Resp 18   Ht 5\' 7"  (1.702 m)   Wt 79.4 kg   LMP 03/17/2012   SpO2 99%   BMI 27.41 kg/m  Physical Exam Vitals and nursing note reviewed.  Constitutional:      Appearance: She is well-developed.  HENT:     Head: Normocephalic and atraumatic.  Eyes:     Extraocular Movements: Extraocular movements intact.  Cardiovascular:     Rate and Rhythm: Normal rate.  Pulmonary:     Effort: Pulmonary effort is normal.  Musculoskeletal:     Cervical back: Normal range of motion and neck supple.  Skin:    General: Skin is dry.     Findings: Bruising present. No  lesion.  Neurological:     Mental Status: She is alert and oriented to person, place, and time.     ED Results / Procedures / Treatments   Labs (all labs ordered are listed, but only abnormal results are displayed) Labs Reviewed - No data to display  EKG None  Radiology No results found.  Procedures Procedures    Medications Ordered in ED Medications  acetaminophen (TYLENOL) tablet 650 mg (650 mg Oral Given 09/17/23 1611)    ED Course/ Medical Decision Making/ A&P                                 Medical Decision Making Risk OTC drugs.   62 year old patient comes in after sustaining what appears to be a mechanical fall. Pertinent past medical includes anemia.  Patient is not on blood thinners.  Based on my history and exam, differential diagnosis includes: - Traumatic brain injury including intracranial hemorrhage - Long bone fractures - Contusions - Soft tissue injury - Concussion  Based on the initial assessment, the following workup was initiated CT scan of the brain.  I have independently interpreted the following imaging from the perspective of acute trauma: CT scan of  the brain and the results indicate no clear evidence of brain bleed.  CT read pending at this time.  Anticipate discharge.    Final Clinical Impression(s) / ED Diagnoses Final diagnoses:  Fall, initial encounter  Hematoma    Rx / DC Orders ED Discharge Orders     None         Derwood Kaplan, MD 09/23/23 2350

## 2023-09-17 NOTE — ED Provider Notes (Signed)
  Physical Exam  BP (!) 140/81   Pulse (!) 56   Temp 98.2 F (36.8 C) (Oral)   Resp 18   Ht 5\' 7"  (1.702 m)   Wt 79.4 kg   LMP 03/17/2012   SpO2 100%   BMI 27.41 kg/m   Physical Exam Vitals and nursing note reviewed.  HENT:     Head: Normocephalic and atraumatic.  Eyes:     Pupils: Pupils are equal, round, and reactive to light.  Cardiovascular:     Rate and Rhythm: Normal rate and regular rhythm.  Pulmonary:     Effort: Pulmonary effort is normal.     Breath sounds: Normal breath sounds.  Abdominal:     Palpations: Abdomen is soft.     Tenderness: There is no abdominal tenderness.  Skin:    General: Skin is warm and dry.  Neurological:     Mental Status: She is alert.  Psychiatric:        Mood and Affect: Mood normal.     Procedures  Procedures  ED Course / MDM    Medical Decision Making I, Estelle June DO, have assumed care of this patient from the previous provider pending CT head, CT C-spine, reevaluation and disposition  No acute traumatic findings on CT head or CT C-spine Patient remains neurologically intact able to ambulate without difficulty Stable for discharge with instruction for PCP follow-up Return precautions were discussed in detail  Risk OTC drugs.   Final diagnosis Hematoma Lunette Stands, DO 09/17/23 1647

## 2024-09-01 DIAGNOSIS — R002 Palpitations: Secondary | ICD-10-CM | POA: Insufficient documentation

## 2024-09-01 DIAGNOSIS — R000 Tachycardia, unspecified: Secondary | ICD-10-CM | POA: Diagnosis not present

## 2024-09-02 ENCOUNTER — Encounter (HOSPITAL_COMMUNITY): Payer: Self-pay

## 2024-09-02 ENCOUNTER — Emergency Department (HOSPITAL_COMMUNITY)
Admission: EM | Admit: 2024-09-02 | Discharge: 2024-09-02 | Disposition: A | Discharge status: Home / Self Care | Attending: Emergency Medicine | Admitting: Emergency Medicine

## 2024-09-02 ENCOUNTER — Other Ambulatory Visit: Payer: Self-pay

## 2024-09-02 ENCOUNTER — Emergency Department (HOSPITAL_COMMUNITY)

## 2024-09-02 DIAGNOSIS — I480 Paroxysmal atrial fibrillation: Secondary | ICD-10-CM

## 2024-09-02 LAB — BASIC METABOLIC PANEL WITH GFR
Anion gap: 9 (ref 5–15)
BUN: 13 mg/dL (ref 8–23)
CO2: 24 mmol/L (ref 22–32)
Calcium: 9.7 mg/dL (ref 8.9–10.3)
Chloride: 109 mmol/L (ref 98–111)
Creatinine, Ser: 0.85 mg/dL (ref 0.44–1.00)
GFR, Estimated: >60 mL/min (ref >60)
Glucose, Bld: 93 mg/dL (ref 70–99)
Potassium: 4.2 mmol/L (ref 3.5–5.1)
Sodium: 142 mmol/L (ref 135–145)

## 2024-09-02 LAB — CBC
HCT: 39.3 % (ref 36.0–46.0)
Hemoglobin: 12.3 g/dL (ref 12.0–15.0)
MCH: 25.6 pg — ABNORMAL LOW (ref 26.0–34.0)
MCHC: 31.3 g/dL (ref 30.0–36.0)
MCV: 81.9 fL (ref 80.0–100.0)
Platelets: 167 K/uL (ref 150–400)
RBC: 4.8 MIL/uL (ref 3.87–5.11)
RDW: 15.2 % (ref 11.5–15.5)
WBC: 6.3 K/uL (ref 4.0–10.5)
nRBC: 0 % (ref 0.0–0.2)

## 2024-09-02 LAB — TSH: TSH: 3.174 u[IU]/mL (ref 0.350–4.500)

## 2024-09-02 LAB — TROPONIN I (HIGH SENSITIVITY): Troponin I (High Sensitivity): 3 ng/L (ref <18)

## 2024-09-02 MED ORDER — APIXABAN 5 MG PO TABS: 5.0000 mg | ORAL_TABLET | Freq: Two times a day (BID) | ORAL | 0 refills | Status: DC

## 2024-09-02 MED ORDER — APIXABAN 5 MG PO TABS
5.0000 mg | ORAL_TABLET | Freq: Once | ORAL | Status: AC
  Administered 2024-09-02: 5 mg via ORAL
  Filled 2024-09-02: qty 1

## 2024-09-02 NOTE — ED Triage Notes (Signed)
 Pt was eating an Icee and felt a reaction to the cold so she put her fingers to her neck to check her pulse and felt like it was beating erratic. Pt had her son check with a stethoscope and he said it was all over the place. Pt denies this occurring before and any cardiac hx.

## 2024-09-02 NOTE — ED Provider Notes (Addendum)
 [place] EM[facility]ENCY DE[facility]MENT AT [facility] Provider Note   CSN: [id number] Arrival date & time: 09/01/24  2351     Patient presents with: Tachycardia   Natasha Nash is a 63 y.o. female.   Patient is a 63 year old female with history of GERD and uterine fibroids.  Patient presenting today with complaints of palpitations.  She reports eating [place] ice several hours ago.  While eating this, she developed fluttering in her chest and palpitations.  She had her son listen to her heart and her rate was rapid and irregular.  Patient denies any chest pain or difficulty breathing.  She denies any history of arrhythmias.       Prior to Admission medications   Medication Sig Start Date End Date Taking? Authorizing Provider  Cholecalciferol (VITAMIN D  PO) Take by mouth. 5,000 units daily Patient not taking: Reported on 11/27/2022    [provider]  naproxen  (NAPROSYN ) 500 MG tablet Take 1 tablet (500 mg total) by mouth 2 (two) times daily with a meal. Patient not taking: Reported on 11/27/2022 01/23/21   Booker Darice SAUNDERS, FNP  pantoprazole  (PROTONIX ) 40 MG tablet Take 1 tablet (40 mg total) by mouth daily. 11/27/22   Alphonsa Glendia LABOR, MD    Allergies: Patient has no known allergies.    Review of Systems  All other systems reviewed and are negative.   Updated Vital Signs BP (!) 137/91   Pulse 78   Temp 98.3 F (36.8 C) (Oral)   Resp 18   Ht 5' 7 (1.702 m)   Wt 80.7 kg   LMP 03/17/2012   SpO2 98%   BMI 27.88 kg/m   Physical Exam Vitals and nursing note reviewed.  Constitutional:      General: She is not in acute distress.    Appearance: She is well-developed. She is not diaphoretic.  HENT:     Head: Normocephalic and atraumatic.  Cardiovascular:     Rate and Rhythm: Normal rate and regular rhythm.     Heart sounds: No murmur heard.    No friction rub. No gallop.  Pulmonary:     Effort: Pulmonary effort is normal. No respiratory distress.      Breath sounds: Normal breath sounds. No wheezing.  Abdominal:     General: Bowel sounds are normal. There is no distension.     Palpations: Abdomen is soft.     Tenderness: There is no abdominal tenderness.  Musculoskeletal:        General: Normal range of motion.     Cervical back: Normal range of motion and neck supple.  Skin:    General: Skin is warm and dry.  Neurological:     General: No focal deficit present.     Mental Status: She is alert and oriented to person, place, and time.     (all labs ordered are listed, but only abnormal results are displayed) Labs Reviewed  CBC - Abnormal; Notable for the following components:      Result Value   MCH 25.6 (*)    All other components within normal limits  BASIC METABOLIC PANEL WITH GFR  TROPONIN I (HIGH SENSITIVITY)  TROPONIN I (HIGH SENSITIVITY)    EKG: EKG Interpretation Date/Time:  Thursday September 02 2024 01:13:46 EDT Ventricular Rate:  98 PR Interval:    QRS Duration:  81 QT Interval:  325 QTC Calculation: 415 R Axis:   65  Text Interpretation: Atrial fibrillation Nonspecific T abnormalities, inferior leads Confirmed by Ellianne Gowen,  Vicenta ([id number]) on 09/02/2024 1:25:07 AM  Radiology: DG Chest 2 View Result Date: 09/02/2024 EXAM: 2 VIEW(S) XRAY OF THE CHEST 09/02/2024 01:33:00 AM COMPARISON: Comparison 12/25/2009. CLINICAL HISTORY: tachycardia. tachycardia FINDINGS: LUNGS AND PLEURA: No focal pulmonary opacity. No pulmonary edema. No pleural effusion. No pneumothorax. HEART AND MEDIASTINUM: No acute abnormality of the cardiac and mediastinal silhouettes. BONES AND SOFT TISSUES: No acute osseous abnormality. IMPRESSION: 1. No acute abnormalities. Electronically signed by: Pinkie Pebbles MD 09/02/2024 01:42 AM EDT RP Workstation: [id number]     Procedures   Medications Ordered in the ED - No data to display                                  Medical Decision Making Amount and/or Complexity of Data Reviewed Labs:  ordered. Radiology: ordered.  Risk Prescription drug management.   Patient with no prior cardiac history or hypertension presenting with palpitations.  Patient arrives here with stable vital signs and is afebrile.  Physical examination at the time of my evaluation is unremarkable.  Laboratory studies obtained including CBC, basic metabolic panel, and troponin, all of which are unremarkable.  Chest x-ray is clear.  Prior to my making it in to see the patient, she spontaneously converted back to a sinus rhythm and is now symptom-free.  At this point, patient is a CHADS2 score of 1 only receiving 1 point for female sex.  I discussed whether or not to initiate a blood thinner with the patient.  Given the fact that she has never had an echocardiogram, I feel as though initiation of Eliquis  is appropriate until followed up by cardiology.    Final diagnoses:  None    ED Discharge Orders     None          Geroldine Vicenta, MD 09/02/24 [id number]    Geroldine Vicenta, MD 09/02/24 [phone]

## 2024-09-02 NOTE — Discharge Instructions (Addendum)
 Begin taking Eliquis  as prescribed.  Follow-up with cardiology in the next week.  The contact information for the cardiology clinic here in [place] has been provided in this discharge summary for you to call and make these arrangements.

## 2024-09-21 ENCOUNTER — Ambulatory Visit: Payer: Self-pay

## 2024-09-21 NOTE — Telephone Encounter (Signed)
 Message from [place] G sent at 09/21/2024 10:34 AM EDT  Reason for Triage: pt has questions about the blood thinner that she on please give pt a callback about this matter.

## 2024-09-21 NOTE — Telephone Encounter (Signed)
  FYI Only or Action Required?: Action required by provider: update on patient condition.  Patient was last seen in primary care on 11/27/2022 by Alphonsa Glendia LABOR, MD.  Called Nurse Triage reporting Chest Pain.  Symptoms began today.  Interventions attempted: Other: drank cold water.  Symptoms are: completely resolved.  Triage Disposition: See Physician Within 24 Hours  Patient/caregiver understands and will follow disposition?: Unsure, plans to keep appointment for 09/23/2024  Copied from CRM #[id number]. Topic: Clinical - Red Word Triage >> Sep 21, 2024 12:25 PM [place] R wrote: Triaged today for other reason and scheduled for 10/16 however pt is experiencing pressure in middle of chest when she drinks water is relieves the pressure Reason for Disposition  [1] Chest pain lasts < 5 minutes AND [2] NO chest pain or cardiac symptoms (e.g., breathing difficulty, sweating) now  (Exception: Chest pains that last only a few seconds.)  Answer Assessment - Initial Assessment Questions Patient will keep appointment scheduled for 09/23/2024. Patient will call EMS or proceed to ED with another person to drive her is symptoms worsen  Phone call disconnected, Left Message with the son on patient to call back with any other questions.  1. LOCATION: Where does it hurt?       Center of chest 2. RADIATION: Does the pain go anywhere else? (e.g., into neck, jaw, arms, back)     Denies radiation 3. ONSET: When did the chest pain begin? (Minutes, hours or days)      Moments before phone call 4. PATTERN: Does the pain come and go, or has it been constant since it started?  Does it get worse with exertion?      Comes and goes 5. DURATION: How long does it last (e.g., seconds, minutes, hours)     4-5 minutes, resolved after drinking water 6. SEVERITY: How bad is the pain?  (e.g., Scale 1-10; mild, moderate, or severe)     Non painful 7. CARDIAC RISK FACTORS: Do you have any history of heart  problems or risk factors for heart disease? (e.g., angina, prior heart attack; diabetes, high blood pressure, high cholesterol, smoker, or strong family history of heart disease)     Recent diagnosis of Afib 8. PULMONARY RISK FACTORS: Do you have any history of lung disease?  (e.g., blood clots in lung, asthma, emphysema, birth control pills)     denies 9. CAUSE: What do you think is causing the chest pain?     Unknown if cardiac in nature.  Patient also has a history of GERD and costochondritis 10. OTHER SYMPTOMS: Do you have any other symptoms? (e.g., dizziness, nausea, vomiting, sweating, fever, difficulty breathing, cough)       denies 11. PREGNANCY: Is there any chance you are pregnant? When was your last menstrual period?       N/a  Protocols used: Chest Pain-A-AH

## 2024-09-21 NOTE — Telephone Encounter (Signed)
 Patient requesting OV for medication refills. Appt scheduled 09/23/24 with PCP. Patient reports she never got hospital f/u scheduled since last ED visit on 09/02/24 due to no available visits until Dec. Please advise if OV needs to be scheduled as hospital f/u or can stay as regular OV . Please advise.      FYI Only or Action Required?: Action required by provider: medication refill request and update on patient condition.  Patient was last seen in primary care on 11/27/2022 by Alphonsa Glendia LABOR, MD.  Called Nurse Triage reporting Medication Problem.  Symptoms began several months ago.  Interventions attempted: Prescription medications: eliquis  .  Symptoms are: no sx now   Triage Disposition: Call Pharmacist Within 24 Hours OV scheduled with in 48 hours.  Patient/caregiver understands and will follow disposition?: Yes                 Reason for Disposition  [1] Caller has medicine question about med NOT prescribed by primary care doctor (or NP/PA) or specialist AND [2] triager unable to answer question (e.g., compatibility with other med, storage)  Answer Assessment - Initial Assessment Questions Appt scheduled for medication refills 09/23/24. Patient reports she never got Hospital f/u appt with PCP after ED visit 09/02/24. Patient was started on eliquis  5 mg for rapid heart rate and does not f/u with cardiology until Dec. Patient about to run out of medication .      1. NAME of MEDICINE: What medicine(s) are you calling about?     Eliquis  5 mg 2. QUESTION: What is your question? (e.g., double dose of medicine, side effect)     Do you need to continue or need to refill?  3. PRESCRIBER: Who prescribed the medicine? Reason: if prescribed by specialist, call should be referred to that group.     Dr. Vicenta Able - ED provider 4. SYMPTOMS: Do you have any symptoms? If Yes, ask: What symptoms are you having?  How bad are the symptoms (e.g., mild, moderate,  severe)     None now. Was put on medication due to rapid heart rate 5. PREGNANCY:  Is there any chance that you are pregnant? When was your last menstrual period?     na  Protocols used: Medication Question Call-A-AH

## 2024-09-21 NOTE — Telephone Encounter (Signed)
 FYI Only or Action Required?: FYI only for provider.  Patient was last seen in primary care on 11/27/2022 by Alphonsa Glendia LABOR, MD.  Called Nurse Triage reporting Chest Pain.  Symptoms began today.  Interventions attempted: Other: Drinking water.  Symptoms are: completely resolved.  Triage Disposition: See Physician Within 24 Hours  Patient/caregiver understands and will follow disposition?: No, refuses disposition  Pt returning call after triage call dropped earlier, see triage assessment below. Was in the middle of deciding whether to schedule sooner appt or to keep her current appt with PCP this Thursday before call dropped. Pt reports she will just keep current appt.

## 2024-09-21 NOTE — Telephone Encounter (Addendum)
 Pt returning call after initial triage call dropped earlier today. See full triage assessment below.

## 2024-09-22 NOTE — Telephone Encounter (Signed)
 We look forward to seeing her at that time

## 2024-09-22 NOTE — Telephone Encounter (Signed)
 Patient has appointment scheduled 09/23/24 with Dr Glendia

## 2024-09-22 NOTE — Telephone Encounter (Signed)
 Duplicate message- Appointment scheduled- patient requested appointment scheduled 09/23/24

## 2024-09-23 ENCOUNTER — Encounter: Payer: Self-pay | Admitting: Family Medicine

## 2024-09-23 ENCOUNTER — Ambulatory Visit: Payer: Self-pay | Admitting: Family Medicine

## 2024-09-23 ENCOUNTER — Other Ambulatory Visit: Payer: Self-pay

## 2024-09-23 VITALS — BP 120/78 | HR 90 | Temp 98.6°F | Ht 67.0 in | Wt 173.8 lb

## 2024-09-23 DIAGNOSIS — I480 Paroxysmal atrial fibrillation: Secondary | ICD-10-CM | POA: Diagnosis not present

## 2024-09-23 DIAGNOSIS — I4891 Unspecified atrial fibrillation: Secondary | ICD-10-CM

## 2024-09-23 MED ORDER — APIXABAN 5 MG PO TABS: 5.0000 mg | ORAL_TABLET | Freq: Two times a day (BID) | ORAL | 2 refills | Status: DC

## 2024-09-23 NOTE — Progress Notes (Signed)
 eco  Subjective:    Patient ID: Natasha Nash, female    DOB: [birth year], 63 y.o.   MRN: [id number]  HPI Pt. Is in room 6.  Pt. Has had tightness in the chest area that comes and goes.  Pt. Stated it started Wednesday Sept. 24th in the evening time.  Pt. Has a cardiologist appointment scheduled for Dec. 4th.  Discussed the use of AI scribe software for clinical note transcription with the patient, who gave verbal consent to proceed.  History of Present Illness   Natasha Nash is a 63 year old female who presents with an episode of rapid heart rate and palpitations.  On September 25th, she experienced an episode of rapid heart rate and palpitations immediately after consuming ice cream quickly before bedtime. She described a pressure sensation and noted her pulse was 'beating like crazy'. Her son, Juliene, confirmed her heart was beating too fast using a stethoscope. She was not engaged in any physical activity at the time and had not experienced similar episodes in the past.  She went to the emergency room where her vitals were checked, and she underwent an EKG and chest X-ray. She reported that in the emergency room, she did not receive any medication and was told by the doctor that her heart rhythm returned to normal on its own.  She has a history of avoiding coffee for six to seven months due to feeling jittery but resumed drinking one cup daily since mid-July. She does not consume soda and has been cautious with herbal teas due to her condition.  In terms of physical activity, she reported no issues with mild to moderate exertion, such as carrying and stacking wood, and has not experienced any breathing difficulties or chest pain during these activities.  Her sleep pattern involves going to bed around 10:30 PM, and she generally sleeps well, waking occasionally to use the bathroom but returning to sleep easily.  Family history is notable for her son, Juliene, having a similar heart rhythm  issue, but no other known family members with irregular heart rhythms.  No recent illness, dehydration, or overexertion prior to the episode. She has not experienced any breathing difficulties or chest pain with physical activity.     Patient denies substernal pain with activity No passing out spells EKGs blood work details from the ER reviewed with the patient Discussion held regarding what atrial fibrillation is and what it can lead to in regards to increased risk of stroke Discussion of CHADS2 score and relevance Discussion of anticoagulants discussing the pros and cons regarding long-term use   Review of Systems     Objective:   Physical Exam General-in no acute distress Eyes-no discharge Lungs-respiratory rate normal, CTA CV-no murmurs,RRR Extremities skin warm dry no edema Neuro grossly normal Behavior normal, alert        Assessment & Plan:   Assessment and Plan    Atrial fibrillation EKG confirmed atrial fibrillation, spontaneously reverted to normal rhythm. ITALY score of 1 indicates low stroke risk. Discussed atrial fibrillation pathophysiology, recurrence potential, and heart rate monitoring. Explained anticoagulation therapy's role in stroke prevention and bleeding risk. Shared decision-making on anticoagulation therapy, considering discontinuation if echocardiogram is favorable. - Order echocardiogram to assess heart structure and function. - Refer to cardiologist for further evaluation and management. - Continue anticoagulation therapy until cardiology evaluation. - Instruct on heart rate monitoring and emergency care if heart rate exceeds 120-150 bpm and persists. - Advise against NSAIDs and aspirin while on  anticoagulation therapy.  General Health Maintenance Discussed caffeine intake and its potential effects on heart rhythm. - Encourage use of decaffeinated tea and moderate coffee consumption.      1. Paroxysmal atrial fibrillation (HCC) (Primary) There  is no sign of atrial fibrillation now but she clearly was in atrial fibs earlier her CHADS2 score is 1 Because patient has not had a echo ER doctor felt it was necessary to stay on this medication for now I recommend checking an echo seeing cardiology If her echo looks completely normal strong consideration for stopping Eliquis  with a CHADS2 score of 1  Patient is to monitor herself if she has intermittent spells especially of fast heart rate she was told when to go to ER when to call us  If she is having frequent spells consideration for beta-blocker on a as needed basis or perhaps diltiazem

## 2024-10-21 ENCOUNTER — Ambulatory Visit: Payer: Self-pay | Admitting: Family Medicine

## 2024-10-21 ENCOUNTER — Ambulatory Visit (HOSPITAL_COMMUNITY)
Admission: RE | Admit: 2024-10-21 | Discharge: 2024-10-21 | Disposition: A | Discharge status: Home / Self Care | Source: Ambulatory Visit | Attending: Family Medicine | Admitting: Family Medicine

## 2024-10-21 DIAGNOSIS — I4891 Unspecified atrial fibrillation: Secondary | ICD-10-CM | POA: Diagnosis not present

## 2024-10-21 LAB — ECHOCARDIOGRAM COMPLETE
Area-P 1/2: 4.8 cm2
S' Lateral: 3.2 cm

## 2024-10-21 NOTE — Progress Notes (Signed)
*  PRELIMINARY RESULTS* Echocardiogram 2D Echocardiogram has been performed.  Natasha Nash 10/21/2024, 10:17 AM

## 2024-11-11 ENCOUNTER — Encounter: Payer: Self-pay | Admitting: Cardiology

## 2024-11-11 ENCOUNTER — Ambulatory Visit: Attending: Cardiology | Admitting: Cardiology

## 2024-11-11 VITALS — BP 126/76 | HR 74 | Ht 67.0 in | Wt 181.0 lb

## 2024-11-11 DIAGNOSIS — I480 Paroxysmal atrial fibrillation: Secondary | ICD-10-CM | POA: Diagnosis not present

## 2024-11-11 MED ORDER — METOPROLOL TARTRATE 25 MG PO TABS: 25.0000 mg | ORAL_TABLET | Freq: Three times a day (TID) | ORAL | 3 refills | Status: AC | PRN

## 2024-11-11 NOTE — Patient Instructions (Signed)
 Medication Instructions:  Your physician has recommended you make the following change in your medication:  Stop Eliquis   Start Metoprolol Tartrate 25 mg every 8 hours as needed for palpitations  Continue taking all other medications as prescribed   Labwork: None  Testing/Procedures: None  Follow-Up: Your physician recommends that you schedule a follow-up appointment in: 4 months w/Elizabeth  Any Other Special Instructions Will Be Listed Below (If Applicable). Thank you for choosing [facility]!     If you need a refill on your cardiac medications before your next appointment, please call your pharmacy.

## 2024-11-11 NOTE — Progress Notes (Signed)
      Clinical Summary Ms. Gladman is a 63 y.o.female seen today as a new consult, referred by Dr Alphonsa for the following medical problems.     PAF - 09/02/24 ER visit with palpitations - noted to be in afib, new diagnosis at the time, self converted - no recurrent symptoms.    SH: works employment consult at [employer] Past Medical History:  Diagnosis Date   Anemia    Fibroids      No Known Allergies   Current Outpatient Medications  Medication Sig Dispense Refill   apixaban  (ELIQUIS ) 5 MG TABS tablet Take 1 tablet (5 mg total) by mouth 2 (two) times daily. 60 tablet 2   Cholecalciferol (VITAMIN D  PO) Take by mouth. 5,000 units daily (Patient not taking: Reported on 11/27/2022)     pantoprazole  (PROTONIX ) 40 MG tablet Take 1 tablet (40 mg total) by mouth daily. 30 tablet 3   No current facility-administered medications for this visit.     Past Surgical History:  Procedure Laterality Date   TUBAL LIGATION       No Known Allergies    Family History  Problem Relation Age of Onset   Stroke Mother    Heart disease Father      Social History Ms. Judice reports that she has never smoked. She has never used smokeless tobacco. Ms. Glasco reports no history of alcohol use.     Physical Examination Today's Vitals   11/11/24 0946  BP: 126/76  Pulse: 74  SpO2: 98%  Weight: 181 lb (82.1 kg)  Height: 5' 7 (1.702 m)   Body mass index is 28.35 kg/m.  Gen: resting comfortably, no acute distress HEENT: no scleral icterus, pupils equal round and reactive, no palptable cervical adenopathy,  CV: RRR,no m/rg, no jvd Resp: Clear to auscultation bilaterally GI: abdomen is soft, non-tender, non-distended, normal bowel sounds, no hepatosplenomegaly MSK: extremities are warm, no edema.  Skin: warm, no rash Neuro:  no focal deficits Psych: appropriate affect   Diagnostic Studies  10/2024 echo 1. Left ventricular ejection fraction, by estimation, is 55 to 60%.  The  left ventricle has normal function. Left ventricular endocardial border  not optimally defined to evaluate regional wall motion. Left ventricular  diastolic parameters are consistent  with Grade I diastolic dysfunction (impaired relaxation).   2. Right ventricular systolic function is normal. The right ventricular  size is normal. Tricuspid regurgitation signal is inadequate for assessing  PA pressure.   3. Left atrial size was moderately dilated.   4. The mitral valve is normal in structure. Trivial mitral valve  regurgitation. No evidence of mitral stenosis.   5. The aortic valve is tricuspid. Aortic valve regurgitation is not  visualized. No aortic stenosis is present.   6. The inferior vena cava is normal in size with greater than 50%  respiratory variability, suggesting right atrial pressure of 3 mmHg.    Assessment and Plan  1.PAF - isolated episode of afib, self converted. No prior symptoms or symptoms since - will give lopressor  25mg  every 8 hrs just prn palpitations -CHADS2Vasc score is 1, she will stop eliquis    F/u 4 months      Dorn PHEBE Ross, M.D.

## 2025-02-02 ENCOUNTER — Encounter: Admitting: Obstetrics & Gynecology

## 2025-03-17 ENCOUNTER — Ambulatory Visit: Admitting: Nurse Practitioner
